# Patient Record
Sex: Male | Born: 1935
Health system: Southern US, Community
[De-identification: ages and names within clinical notes are randomized; demographics above are authoritative.]

## PROBLEM LIST (undated history)

## (undated) DIAGNOSIS — M109 Gout, unspecified: Secondary | ICD-10-CM

## (undated) DIAGNOSIS — C4490 Unspecified malignant neoplasm of skin, unspecified: Secondary | ICD-10-CM

## (undated) DIAGNOSIS — J449 Chronic obstructive pulmonary disease, unspecified: Secondary | ICD-10-CM

## (undated) DIAGNOSIS — I10 Essential (primary) hypertension: Secondary | ICD-10-CM

## (undated) HISTORY — DX: Essential (primary) hypertension: I10

## (undated) HISTORY — DX: Gout, unspecified: M10.9

## (undated) HISTORY — DX: Chronic obstructive pulmonary disease, unspecified: J44.9

## (undated) HISTORY — PX: OTHER SURGICAL HISTORY: SHX169

## (undated) HISTORY — PX: EXTERNAL EAR SURGERY: SHX627

## (undated) HISTORY — DX: Unspecified malignant neoplasm of skin, unspecified: C44.90

---

## 2012-02-02 DIAGNOSIS — C44221 Squamous cell carcinoma of skin of unspecified ear and external auricular canal: Secondary | ICD-10-CM | POA: Insufficient documentation

## 2014-05-15 DIAGNOSIS — I10 Essential (primary) hypertension: Secondary | ICD-10-CM | POA: Insufficient documentation

## 2014-05-15 DIAGNOSIS — J449 Chronic obstructive pulmonary disease, unspecified: Secondary | ICD-10-CM | POA: Insufficient documentation

## 2014-10-11 DIAGNOSIS — E7800 Pure hypercholesterolemia, unspecified: Secondary | ICD-10-CM | POA: Insufficient documentation

## 2015-04-21 DIAGNOSIS — J432 Centrilobular emphysema: Secondary | ICD-10-CM | POA: Insufficient documentation

## 2015-04-21 DIAGNOSIS — M1A0720 Idiopathic chronic gout, left ankle and foot, without tophus (tophi): Secondary | ICD-10-CM | POA: Insufficient documentation

## 2018-01-06 DIAGNOSIS — R972 Elevated prostate specific antigen [PSA]: Secondary | ICD-10-CM | POA: Diagnosis not present

## 2018-01-06 DIAGNOSIS — I10 Essential (primary) hypertension: Secondary | ICD-10-CM | POA: Diagnosis not present

## 2018-01-06 DIAGNOSIS — J432 Centrilobular emphysema: Secondary | ICD-10-CM | POA: Diagnosis not present

## 2018-01-06 DIAGNOSIS — E7800 Pure hypercholesterolemia, unspecified: Secondary | ICD-10-CM | POA: Diagnosis not present

## 2018-02-22 DIAGNOSIS — Z85828 Personal history of other malignant neoplasm of skin: Secondary | ICD-10-CM | POA: Diagnosis not present

## 2018-02-22 DIAGNOSIS — L570 Actinic keratosis: Secondary | ICD-10-CM | POA: Diagnosis not present

## 2018-02-22 DIAGNOSIS — X32XXXA Exposure to sunlight, initial encounter: Secondary | ICD-10-CM | POA: Diagnosis not present

## 2018-02-22 DIAGNOSIS — D485 Neoplasm of uncertain behavior of skin: Secondary | ICD-10-CM | POA: Diagnosis not present

## 2018-02-22 DIAGNOSIS — C4442 Squamous cell carcinoma of skin of scalp and neck: Secondary | ICD-10-CM | POA: Diagnosis not present

## 2018-02-22 DIAGNOSIS — Z872 Personal history of diseases of the skin and subcutaneous tissue: Secondary | ICD-10-CM | POA: Diagnosis not present

## 2018-02-22 DIAGNOSIS — Z08 Encounter for follow-up examination after completed treatment for malignant neoplasm: Secondary | ICD-10-CM | POA: Diagnosis not present

## 2018-03-02 DIAGNOSIS — R972 Elevated prostate specific antigen [PSA]: Secondary | ICD-10-CM | POA: Diagnosis not present

## 2018-04-12 DIAGNOSIS — L905 Scar conditions and fibrosis of skin: Secondary | ICD-10-CM | POA: Diagnosis not present

## 2018-04-12 DIAGNOSIS — C4442 Squamous cell carcinoma of skin of scalp and neck: Secondary | ICD-10-CM | POA: Diagnosis not present

## 2018-05-23 DIAGNOSIS — E7800 Pure hypercholesterolemia, unspecified: Secondary | ICD-10-CM | POA: Diagnosis not present

## 2018-05-23 DIAGNOSIS — M1A0720 Idiopathic chronic gout, left ankle and foot, without tophus (tophi): Secondary | ICD-10-CM | POA: Diagnosis not present

## 2018-05-23 DIAGNOSIS — J432 Centrilobular emphysema: Secondary | ICD-10-CM | POA: Diagnosis not present

## 2018-05-23 DIAGNOSIS — Z0000 Encounter for general adult medical examination without abnormal findings: Secondary | ICD-10-CM | POA: Diagnosis not present

## 2018-05-23 DIAGNOSIS — R972 Elevated prostate specific antigen [PSA]: Secondary | ICD-10-CM | POA: Diagnosis not present

## 2018-05-23 DIAGNOSIS — I10 Essential (primary) hypertension: Secondary | ICD-10-CM | POA: Diagnosis not present

## 2018-07-19 DIAGNOSIS — L570 Actinic keratosis: Secondary | ICD-10-CM | POA: Diagnosis not present

## 2018-07-19 DIAGNOSIS — Z08 Encounter for follow-up examination after completed treatment for malignant neoplasm: Secondary | ICD-10-CM | POA: Diagnosis not present

## 2018-07-19 DIAGNOSIS — Z872 Personal history of diseases of the skin and subcutaneous tissue: Secondary | ICD-10-CM | POA: Diagnosis not present

## 2018-07-19 DIAGNOSIS — D485 Neoplasm of uncertain behavior of skin: Secondary | ICD-10-CM | POA: Diagnosis not present

## 2018-07-19 DIAGNOSIS — Z85828 Personal history of other malignant neoplasm of skin: Secondary | ICD-10-CM | POA: Diagnosis not present

## 2018-07-19 DIAGNOSIS — C44319 Basal cell carcinoma of skin of other parts of face: Secondary | ICD-10-CM | POA: Diagnosis not present

## 2018-07-19 DIAGNOSIS — X32XXXA Exposure to sunlight, initial encounter: Secondary | ICD-10-CM | POA: Diagnosis not present

## 2018-08-07 ENCOUNTER — Other Ambulatory Visit: Payer: Self-pay | Admitting: *Deleted

## 2018-08-07 NOTE — Patient Outreach (Signed)
Telephone call to pt for follow up on HTA health risk assessment completed by pt, spoke with pt, HIPAA verified, pt reports he has COPD and managed " very well" , has HTN and cholesterol issues that are under control.  Pt states " I'm doing great, getting outdoors"  Pt states he sees Dr. Thereasa Distance q 6 months at [facility] in [place].  Pt states he has no needs for care management.  Successful outreach letter mailed to pt home.  Jacqlyn Larsen [facility], [place] Coordinator [phone]

## 2018-09-20 DIAGNOSIS — C44319 Basal cell carcinoma of skin of other parts of face: Secondary | ICD-10-CM | POA: Diagnosis not present

## 2018-10-04 ENCOUNTER — Other Ambulatory Visit: Payer: Self-pay | Admitting: Family Medicine

## 2018-10-04 DIAGNOSIS — R0789 Other chest pain: Secondary | ICD-10-CM | POA: Diagnosis not present

## 2018-10-04 DIAGNOSIS — J449 Chronic obstructive pulmonary disease, unspecified: Secondary | ICD-10-CM

## 2018-10-04 DIAGNOSIS — J432 Centrilobular emphysema: Secondary | ICD-10-CM | POA: Diagnosis not present

## 2018-10-04 DIAGNOSIS — R0602 Shortness of breath: Secondary | ICD-10-CM | POA: Diagnosis not present

## 2018-10-09 ENCOUNTER — Other Ambulatory Visit: Payer: Self-pay | Admitting: Family Medicine

## 2018-10-09 DIAGNOSIS — R0602 Shortness of breath: Secondary | ICD-10-CM

## 2018-10-16 ENCOUNTER — Ambulatory Visit
Admission: RE | Admit: 2018-10-16 | Discharge: 2018-10-16 | Disposition: A | Discharge status: Home / Self Care | Payer: PPO | Source: Ambulatory Visit | Attending: Family Medicine | Admitting: Family Medicine

## 2018-10-16 DIAGNOSIS — N281 Cyst of kidney, acquired: Secondary | ICD-10-CM | POA: Diagnosis not present

## 2018-10-16 DIAGNOSIS — K769 Liver disease, unspecified: Secondary | ICD-10-CM | POA: Diagnosis not present

## 2018-10-16 DIAGNOSIS — J439 Emphysema, unspecified: Secondary | ICD-10-CM | POA: Diagnosis not present

## 2018-10-16 DIAGNOSIS — J432 Centrilobular emphysema: Secondary | ICD-10-CM | POA: Diagnosis not present

## 2018-10-16 DIAGNOSIS — R918 Other nonspecific abnormal finding of lung field: Secondary | ICD-10-CM | POA: Insufficient documentation

## 2018-10-16 DIAGNOSIS — R0602 Shortness of breath: Secondary | ICD-10-CM | POA: Diagnosis not present

## 2018-10-16 DIAGNOSIS — I2510 Atherosclerotic heart disease of native coronary artery without angina pectoris: Secondary | ICD-10-CM | POA: Diagnosis not present

## 2018-10-24 ENCOUNTER — Telehealth: Payer: Self-pay | Admitting: *Deleted

## 2018-10-24 NOTE — Telephone Encounter (Signed)
Left message on patients phone, per Dr.Oaks to see if patient can come in at 10:00 tomorrow (10/25/18) for a 10:30am appointment. Dr.Oaks has a case at noon so patient needs to be here at 10:00am

## 2018-10-25 ENCOUNTER — Other Ambulatory Visit: Payer: Self-pay | Admitting: *Deleted

## 2018-10-25 ENCOUNTER — Other Ambulatory Visit: Payer: Self-pay

## 2018-10-25 ENCOUNTER — Encounter: Payer: Self-pay | Admitting: Cardiothoracic Surgery

## 2018-10-25 ENCOUNTER — Ambulatory Visit (INDEPENDENT_AMBULATORY_CARE_PROVIDER_SITE_OTHER): Payer: PPO | Admitting: Cardiothoracic Surgery

## 2018-10-25 VITALS — BP 169/89 | HR 65 | Temp 97.9°F | Resp 16 | Ht 67.0 in | Wt 139.8 lb

## 2018-10-25 DIAGNOSIS — R918 Other nonspecific abnormal finding of lung field: Secondary | ICD-10-CM

## 2018-10-25 NOTE — Progress Notes (Signed)
Patient ID: Darin Williams, male   DOB: 11-09-1936, 82 y.o.   MRN: [id number]  Chief Complaint  Patient presents with  . New Patient (Initial Visit)    Mass right lung    Referred By Dr. Craige Cotta Reason for Referral right upper lobe mass  HPI Location, Quality, Duration, Severity, Timing, Context, Modifying Factors, Associated Signs and Symptoms.  Darin Williams is a 82 y.o. male.  He was in his usual state of health until couple weeks ago when he began experiencing increasing shortness of breath particularly while singing in church.  He states that he had a slight cough and was thinking that he might be developing a cold and he sought attention with his primary care physician.  A chest x-ray was made and a subsequent CT scan was performed.  The patient states that he has had no prior x-rays in the last 20 years for comparison but the CT scan was markedly abnormal with a mass like density in the right upper lung zone.  He was treated with albuterol and oral antibiotics and has improved considerably.  He states that he is now back to his baseline.  He has had a cough which is been nonproductive.  He has a past medical history significant for smoking of 52 years but he quit about 10 years ago.  Also there is a very strong positive family history for lung cancer in his father grandfather and uncle.  They were all smokers.  There is no known asbestos exposure.  He has had a 10 to 12 pound weight loss but has gained several pounds back since he has been on the antibiotics and albuterol.  He does get short of breath with exertion.  He is able to sing in the choir and does so without difficulty.   Past Medical History:  Diagnosis Date  . COPD (chronic obstructive pulmonary disease) ([place])   . Gout   . Hypertension   . Skin cancer     Past Surgical History:  Procedure Laterality Date  . EXTERNAL EAR SURGERY    . mohl's surgery      Family History  Problem Relation Age of Onset  . Dementia  Mother   . Lung cancer Father   . Lung cancer Paternal Uncle   . Lung cancer Paternal Grandfather     Social History Social History   Tobacco Use  . Smoking status: Former Smoker    Last attempt to quit: 2005    Years since quitting: 14.8  . Smokeless tobacco: Never Used  Substance Use Topics  . Alcohol use: Never    Frequency: Never  . Drug use: Never    Not on File  Current Outpatient Medications  Medication Sig Dispense Refill  . albuterol (PROVENTIL HFA;VENTOLIN HFA) 108 (90 Base) MCG/ACT inhaler Inhale into the lungs.    Marland Kitchen amLODipine (NORVASC) 5 MG tablet Take by mouth.    . cetirizine (ZYRTEC) 10 MG tablet Take by mouth.    . levofloxacin (LEVAQUIN) 500 MG tablet Take by mouth.    . levofloxacin (LEVAQUIN) 500 MG tablet Take 500 mg by mouth daily.  0  . lisinopril (PRINIVIL,ZESTRIL) 20 MG tablet Take by mouth.    . Omega-3 1000 MG CAPS Take by mouth.    . simvastatin (ZOCOR) 20 MG tablet Take by mouth.    . SPIRIVA HANDIHALER 18 MCG inhalation capsule PLACE ONE CAPSULE INTO INHALER AND INHALE ONCE DAILY  3  . Multiple Vitamin (MULTI-VITAMINS) TABS Take by  mouth.     No current facility-administered medications for this visit.       Review of Systems A complete review of systems was asked and was negative except for the following positive findings weight loss, hearing loss.  Blood pressure (!) 169/89, pulse 65, temperature 97.9 F (36.6 C), temperature source Temporal, resp. rate 16, height 5\' 7"  (1.702 m), weight 139 lb 12.8 oz (63.4 kg), SpO2 96 %.  Physical Exam CONSTITUTIONAL:  Pleasant, well-developed, well-nourished, and in no acute distress. EYES: Pupils equal and reactive to light, Sclera non-icteric EARS, NOSE, MOUTH AND THROAT:  The oropharynx was clear.  Dentition is absent with denture repair.  Oral mucosa pink and moist. LYMPH NODES:  Lymph nodes in the neck and axillae were normal RESPIRATORY:  Lungs were clear.  Normal respiratory effort without  pathologic use of accessory muscles of respiration CARDIOVASCULAR: Heart was regular without murmurs.  There were no carotid bruits. GI: The abdomen was soft, nontender, and nondistended. There were no palpable masses. There was no hepatosplenomegaly. There were normal bowel sounds in all quadrants. GU:  Rectal deferred.   MUSCULOSKELETAL:  Normal muscle strength and tone.  No clubbing or cyanosis.   SKIN:  There were no pathologic skin lesions.  There were no nodules on palpation.  He has innumerable scars from his prior skin cancer surgery. NEUROLOGIC:  Sensation is normal.  Cranial nerves are grossly intact. PSYCH:  Oriented to person, place and time.  Mood and affect are normal.  Data Reviewed CT scan  I have personally reviewed the patient's imaging, laboratory findings and medical records.    Assessment    Right upper lobe mass    Plan    I have independently reviewed the patient's CT scan and discussed these findings with the patient and his family.  The masslike lesion in the right upper lobe is calcified suggesting chronicity.  There is marked architectural distortion of both lungs given his severe underlying emphysema.  However given his long smoking history and positive family history lung cancer certainly a possibility.  Therefore I would like to proceed with a CT-guided needle biopsy of the right upper lobe masslike area.  I had a long discussion with the patient regarding the options.  These would include repeat CT scan in short-term follow-up, PET scan, bronchoscopy with biopsy and finally CT-guided biopsy.  I reviewed with him the indications and risks as well as the advantages and disadvantages of these techniques.  He would like to proceed on with a CT-guided needle biopsy.  I will set this up.  We will be certain to send the biopsy for cultures as well.  I will see the patient back in 10 days in follow-up.       Nestor Lewandowsky, MD 10/25/2018, 11:05 AM

## 2018-10-25 NOTE — Patient Instructions (Signed)
Dr. Genevive Bi will speak with the radiologist and give you a call.

## 2018-10-27 ENCOUNTER — Telehealth: Payer: Self-pay

## 2018-10-27 NOTE — Telephone Encounter (Signed)
Spoke with patient and he is in agreement with his daughter, Lattie Haw that he would like to stay local for his CT Guided lung biopsy. I let him know that they require a PET scan first and that I was waiting to hear back from Dr Genevive Bi on what type to order. He is amendable to this and will await our call back.

## 2018-10-30 ENCOUNTER — Other Ambulatory Visit: Payer: Self-pay | Admitting: *Deleted

## 2018-10-30 ENCOUNTER — Encounter: Payer: Self-pay | Admitting: *Deleted

## 2018-10-30 DIAGNOSIS — R918 Other nonspecific abnormal finding of lung field: Secondary | ICD-10-CM

## 2018-10-30 NOTE — Progress Notes (Addendum)
Per the radiologist's recommendation, patient would need a PET scan prior to CT guided FNA/core biopsy of RUL lung mass.  It was offered to patient to see if we could get biopsy done at [facility] without having to have a PET scan first per Dr. Genevive Bi' request.   The patient would like to remain local and proceed with PET scan.  The patient has been scheduled for a PET scan at [facility] for 11-02-18 at 12:30 pm (arrive 12 noon). Prep: NPO 6 hours prior. The patient is aware of date, time, and instructions.   Marcie Bal with the Specialty Scheduling Department has been notified of plans for PET scan and will send for radiologist review once results are available so we can get biopsy arranged accordingly.

## 2018-11-02 ENCOUNTER — Ambulatory Visit
Admission: RE | Admit: 2018-11-02 | Discharge: 2018-11-02 | Disposition: A | Discharge status: Home / Self Care | Payer: PPO | Source: Ambulatory Visit | Attending: Cardiothoracic Surgery | Admitting: Cardiothoracic Surgery

## 2018-11-02 DIAGNOSIS — N281 Cyst of kidney, acquired: Secondary | ICD-10-CM | POA: Diagnosis not present

## 2018-11-02 DIAGNOSIS — I700 Atherosclerosis of aorta: Secondary | ICD-10-CM | POA: Insufficient documentation

## 2018-11-02 DIAGNOSIS — K8020 Calculus of gallbladder without cholecystitis without obstruction: Secondary | ICD-10-CM | POA: Insufficient documentation

## 2018-11-02 DIAGNOSIS — R918 Other nonspecific abnormal finding of lung field: Secondary | ICD-10-CM | POA: Insufficient documentation

## 2018-11-02 DIAGNOSIS — J439 Emphysema, unspecified: Secondary | ICD-10-CM | POA: Insufficient documentation

## 2018-11-02 LAB — GLUCOSE, CAPILLARY: Glucose-Capillary: 93 mg/dL (ref 70–99)

## 2018-11-02 MED ORDER — FLUDEOXYGLUCOSE F - 18 (FDG) INJECTION
7.6500 | Freq: Once | INTRAVENOUS | Status: AC | PRN
  Administered 2018-11-02: 7.65 via INTRAVENOUS

## 2018-11-06 DIAGNOSIS — C3411 Malignant neoplasm of upper lobe, right bronchus or lung: Secondary | ICD-10-CM | POA: Diagnosis not present

## 2018-11-07 ENCOUNTER — Telehealth: Payer: Self-pay

## 2018-11-07 NOTE — Telephone Encounter (Signed)
Central scheduled and the patient is scheduled for a CT guided lung biopsy at [facility] on 11/13/18 at 11:00 am. He is to arrive there by 10:00 am. The nurse will call him a few days prior to review instructions. The patient is aware of date, time, and instructions.

## 2018-11-08 NOTE — Telephone Encounter (Signed)
A follow up appointment has been scheduled for the patient to see Dr. Genevive Bi on 11-17-18 at 8:30 am. Patient aware.

## 2018-11-09 ENCOUNTER — Other Ambulatory Visit: Payer: Self-pay | Admitting: Radiology

## 2018-11-10 ENCOUNTER — Ambulatory Visit: Payer: Self-pay | Admitting: Cardiothoracic Surgery

## 2018-11-13 ENCOUNTER — Other Ambulatory Visit: Payer: Self-pay

## 2018-11-13 ENCOUNTER — Ambulatory Visit
Admission: RE | Admit: 2018-11-13 | Discharge: 2018-11-13 | Disposition: A | Discharge status: Home / Self Care | Payer: PPO | Source: Ambulatory Visit | Attending: Interventional Radiology | Admitting: Interventional Radiology

## 2018-11-13 ENCOUNTER — Ambulatory Visit
Admission: RE | Admit: 2018-11-13 | Discharge: 2018-11-13 | Disposition: A | Discharge status: Home / Self Care | Payer: PPO | Source: Ambulatory Visit | Attending: Cardiothoracic Surgery | Admitting: Cardiothoracic Surgery

## 2018-11-13 DIAGNOSIS — J95811 Postprocedural pneumothorax: Secondary | ICD-10-CM | POA: Diagnosis not present

## 2018-11-13 DIAGNOSIS — J439 Emphysema, unspecified: Secondary | ICD-10-CM | POA: Insufficient documentation

## 2018-11-13 DIAGNOSIS — I10 Essential (primary) hypertension: Secondary | ICD-10-CM | POA: Diagnosis not present

## 2018-11-13 DIAGNOSIS — Z87891 Personal history of nicotine dependence: Secondary | ICD-10-CM | POA: Diagnosis not present

## 2018-11-13 DIAGNOSIS — Z79899 Other long term (current) drug therapy: Secondary | ICD-10-CM | POA: Diagnosis not present

## 2018-11-13 DIAGNOSIS — R918 Other nonspecific abnormal finding of lung field: Secondary | ICD-10-CM | POA: Insufficient documentation

## 2018-11-13 DIAGNOSIS — Z8709 Personal history of other diseases of the respiratory system: Secondary | ICD-10-CM | POA: Diagnosis not present

## 2018-11-13 DIAGNOSIS — Z9889 Other specified postprocedural states: Secondary | ICD-10-CM | POA: Insufficient documentation

## 2018-11-13 DIAGNOSIS — D381 Neoplasm of uncertain behavior of trachea, bronchus and lung: Secondary | ICD-10-CM | POA: Diagnosis not present

## 2018-11-13 LAB — CBC
HEMATOCRIT: 44.3 % (ref 39.0–52.0)
HEMOGLOBIN: 14.6 g/dL (ref 13.0–17.0)
MCH: 33.3 pg (ref 26.0–34.0)
MCHC: 33 g/dL (ref 30.0–36.0)
MCV: 101.1 fL — AB (ref 80.0–100.0)
PLATELETS: 260 10*3/uL (ref 150–400)
RBC: 4.38 MIL/uL (ref 4.22–5.81)
RDW: 13.4 % (ref 11.5–15.5)
WBC: 5.7 10*3/uL (ref 4.0–10.5)
nRBC: 0 % (ref 0.0–0.2)

## 2018-11-13 LAB — PROTIME-INR
INR: 0.88
Prothrombin Time: 11.9 seconds (ref 11.4–15.2)

## 2018-11-13 MED ORDER — SODIUM CHLORIDE 0.9 % IV SOLN
INTRAVENOUS | Status: DC
  Administered 2018-11-13: 10:00:00 via INTRAVENOUS

## 2018-11-13 MED ORDER — MIDAZOLAM HCL 5 MG/5ML IJ SOLN
INTRAMUSCULAR | Status: AC
  Filled 2018-11-13: qty 5

## 2018-11-13 MED ORDER — MIDAZOLAM HCL 5 MG/5ML IJ SOLN
INTRAMUSCULAR | Status: AC | PRN
  Administered 2018-11-13: 1 mg via INTRAVENOUS
  Administered 2018-11-13 (×2): 0.5 mg via INTRAVENOUS

## 2018-11-13 MED ORDER — FENTANYL CITRATE (PF) 100 MCG/2ML IJ SOLN
INTRAMUSCULAR | Status: AC | PRN
  Administered 2018-11-13: 50 ug via INTRAVENOUS
  Administered 2018-11-13: 25 ug via INTRAVENOUS

## 2018-11-13 MED ORDER — FENTANYL CITRATE (PF) 100 MCG/2ML IJ SOLN
INTRAMUSCULAR | Status: AC
  Filled 2018-11-13: qty 4

## 2018-11-13 NOTE — Discharge Instructions (Signed)
Needle Biopsy, Care After °Refer to this sheet in the next few weeks. These instructions provide you with information about caring for yourself after your procedure. Your health care provider may also give you more specific instructions. Your treatment has been planned according to current medical practices, but problems sometimes occur. Call your health care provider if you have any problems or questions after your procedure. °What can I expect after the procedure? °After your procedure, it is common to have soreness, bruising, or mild pain at the biopsy site. This should go away in a few days. °Follow these instructions at home: °· Rest as directed by your health care provider. °· Take medicines only as directed by your health care provider. °· There are many different ways to close and cover the biopsy site, including stitches (sutures), skin glue, and adhesive strips. Follow your health care provider's instructions about: °? Biopsy site care. °? Bandage (dressing) changes and removal. °? Biopsy site closure removal. °· Check your biopsy site every day for signs of infection. Watch for: °? Redness, swelling, or pain. °? Fluid, blood, or pus. °Contact a health care provider if: °· You have a fever. °· You have redness, swelling, or pain at the biopsy site that lasts longer than a few days. °· You have fluid, blood, or pus coming from the biopsy site. °· You feel nauseous. °· You vomit. °Get help right away if: °· You have shortness of breath. °· You have trouble breathing. °· You have chest pain. °· You feel dizzy or you faint. °· You have bleeding that does not stop with pressure or a bandage. °· You cough up blood. °· You have pain in your abdomen. °This information is not intended to replace advice given to you by your health care provider. Make sure you discuss any questions you have with your health care provider. °Document Released: 04/29/2015 Document Revised: 05/20/2016 Document Reviewed:  12/09/2014 °Elsevier Interactive Patient Education © 2018 Elsevier Inc. ° °

## 2018-11-13 NOTE — H&P (Signed)
Chief Complaint: Patient was seen in consultation today for right lung mass biopsy at the request of Oaks,Timothy  Referring Physician(s): Oaks,Timothy  Patient Status: [facility] - Out-pt  History of Present Illness: Darin Williams is a 82 y.o. male former smoker with COPD presenting with a 4-5 cm RUL lung mass demonstrating increased metabolic activity by PET with possible right hilar and subcarinal lymph node metastatic activity as well on PET. Was having some cough and dyspnea when diagnosed but now not having any significant symptoms. Has been evaluated by Dr. Genevive Bi who recommended biopsy for tissue diagnosis before determining how best to treat Darin Williams.  Past Medical History:  Diagnosis Date  . COPD (chronic obstructive pulmonary disease) ([place])   . Gout   . Hypertension   . Skin cancer     Past Surgical History:  Procedure Laterality Date  . EXTERNAL EAR SURGERY    . mohl's surgery      Allergies: Patient has no allergy information on record.  Medications: Prior to Admission medications   Medication Sig Start Date End Date Taking? Authorizing Provider  albuterol (PROVENTIL HFA;VENTOLIN HFA) 108 (90 Base) MCG/ACT inhaler Inhale into the lungs. 05/23/18  Yes [provider]  amLODipine (NORVASC) 5 MG tablet Take by mouth. 10/11/14  Yes [provider]  lisinopril (PRINIVIL,ZESTRIL) 20 MG tablet Take by mouth. 10/11/14  Yes [provider]  simvastatin (ZOCOR) 20 MG tablet Take by mouth. 10/11/14  Yes [provider]  SPIRIVA HANDIHALER 18 MCG inhalation capsule PLACE ONE CAPSULE INTO INHALER AND INHALE ONCE DAILY 10/18/18  Yes [provider]  cetirizine (ZYRTEC) 10 MG tablet Take by mouth.    [provider]  levofloxacin (LEVAQUIN) 500 MG tablet Take 500 mg by mouth daily. 10/18/18   [provider]  Multiple Vitamin (MULTI-VITAMINS) TABS Take by mouth.    [provider]  Omega-3 1000 MG CAPS Take by  mouth.    [provider]     Family History  Problem Relation Age of Onset  . Dementia Mother   . Lung cancer Father   . Lung cancer Paternal Uncle   . Lung cancer Paternal Grandfather     Social History   Socioeconomic History  . Marital status: Married    Spouse name: Not on file  . Number of children: Not on file  . Years of education: Not on file  . Highest education level: Not on file  Occupational History  . Not on file  Social Needs  . Financial resource strain: Not on file  . Food insecurity:    Worry: Not on file    Inability: Not on file  . Transportation needs:    Medical: Not on file    Non-medical: Not on file  Tobacco Use  . Smoking status: Former Smoker    Last attempt to quit: 2005    Years since quitting: 14.8  . Smokeless tobacco: Never Used  Substance and Sexual Activity  . Alcohol use: Never    Frequency: Never  . Drug use: Never  . Sexual activity: Not on file  Lifestyle  . Physical activity:    Days per week: Not on file    Minutes per session: Not on file  . Stress: Not on file  Relationships  . Social connections:    Talks on phone: Not on file    Gets together: Not on file    Attends religious service: Not on file    Active member of  club or organization: Not on file    Attends meetings of clubs or organizations: Not on file    Relationship status: Not on file  Other Topics Concern  . Not on file  Social History Narrative  . Not on file    ECOG Status: 0 - Asymptomatic  Review of Systems: A 12 point ROS discussed and pertinent positives are indicated in the HPI above.  All other systems are negative.  Review of Systems  Constitutional: Negative.   HENT: Negative.   Respiratory:       Previous dyspnea now improved.  Cardiovascular: Negative.   Gastrointestinal: Negative.   Genitourinary: Negative.   Musculoskeletal: Negative.   Skin:       History of multiple facial procedures for skin cancers.  Neurological:  Negative.     Vital Signs: BP (!) 182/94   Temp 98.5 F (36.9 C) (Oral)   Resp 19   Wt 63.5 kg   SpO2 95%   BMI 21.93 kg/m   Physical Exam  Constitutional: He is oriented to person, place, and time. No distress.  HENT:  Head: Normocephalic and atraumatic.  Neck: Neck supple. No JVD present. No thyromegaly present.  Cardiovascular: Normal rate, regular rhythm and normal heart sounds. Exam reveals no gallop and no friction rub.  No murmur heard. Pulmonary/Chest: Effort normal. No stridor. No respiratory distress. He has no wheezes. He has no rales.  Distant bilateral breath sounds.  Abdominal: Soft. Bowel sounds are normal. He exhibits no distension and no mass. There is no tenderness. There is no rebound and no guarding.  Musculoskeletal: He exhibits no edema.  Lymphadenopathy:    He has no cervical adenopathy.  Neurological: He is alert and oriented to person, place, and time.  Skin: Skin is warm and dry. He is not diaphoretic.  Vitals reviewed.   Imaging: Ct Chest Wo Contrast  Result Date: 10/16/2018 CLINICAL DATA:  82 year old male with increasing shortness of breath while singing, mild cough for 1 month. History of COPD. EXAM: CT CHEST WITHOUT CONTRAST TECHNIQUE: Multidetector CT imaging of the chest was performed following the standard protocol without IV contrast. COMPARISON:  Report of [facility] chest and rib series 10/04/2018 (no images available). FINDINGS: Cardiovascular: Calcified coronary artery atherosclerosis and/or stents. No cardiomegaly or pericardial effusion. Comparatively mild Calcified aortic atherosclerosis. Mediastinum/Nodes: No mediastinal lymphadenopathy. There is no definite hilar lymphadenopathy evident in the absence of IV contrast. Negative noncontrast thoracic inlet, the glottis is closed. Lungs/Pleura: Centrilobular and paraseptal emphysema is moderate to severe throughout both lungs. There is architectural distortion in the right lung apex with  a small volume of layering fluid suspected within an apical bulla (series 2, image 18). Additionally, there is a large partially calcified area of masslike opacity in the posteromedial right upper lobe contiguous with the posterior mediastinum encompassing about 4 x 4.6 centimeters. See series 3, image 36, series 2, image 36 and coronal image 77. There is bronchiectasis associated. This dominant lesion is also contiguous with an additional nodular area parenchymal opacity in the lateral right upper lobe on series 3, image 49 measuring about 2.1 centimeters. Associated architectural distortion throughout the region. No layering pleural effusion. Mild architectural distortion in the superior segment of the right lower lobe and anterior right upper has a more benign appearance. No suspicious finding in the left lung. Upper Abdomen: Large but simple fluid density 11 centimeter right upper pole renal cyst. Partially visible right middle pole cyst. There is moderate atrophy of the left kidney  with a small simple fluid left upper pole cyst also. In the right hepatic lobe there is a low to intermediate hypodense lesion measuring 14 millimeters on series 2, image 144 which is indeterminate. Cholelithiasis is evident with no pericholecystic inflammation. Negative visible noncontrast spleen, pancreas, adrenal glands, and bowel in the upper abdomen. Musculoskeletal: No acute or suspicious osseous lesion identified. IMPRESSION: 1. Emphysema (ICD10-J43.9) with bullous disease and architectural distortion in the right upper lung. Confluent 4.5 cm mass-like area in the posterior right upper lobe contiguous with the posterior mediastinum could be postinflammatory but is indeterminate, and contiguous with a smaller 2.1 cm spiculated area laterally. A small volume of layering fluid in a right apical bulla is suspicious for superimposed infection. Recommend patient referral to Multidisciplinary Thoracic [facility])  and/or consultation with Pulmonary Medicine or Thoracic Surgery. 2. Small indeterminate 14 mm low-density lesion in the posterior right hepatic lobe. If the lung process ultimately is found to be benign then benign etiology here (such as cyst or hemangioma) would be strongly favored. 3. Left renal atrophy. Partially visible renal cysts, including a large but simple fluid density right upper pole cyst. 4. Calcified coronary artery atherosclerosis. Electronically Signed   By: Genevie Ann M.D.   On: 10/16/2018 09:11   Nm Pet Image Initial (pi) Skull Base To Thigh  Result Date: 11/02/2018 CLINICAL DATA:  Initial treatment strategy for right upper lobe mass. EXAM: NUCLEAR MEDICINE PET SKULL BASE TO THIGH TECHNIQUE: 7.7 mCi F-18 FDG was injected intravenously. Full-ring PET imaging was performed from the skull base to thigh after the radiotracer. CT data was obtained and used for attenuation correction and anatomic localization. Fasting blood glucose: 93 mg/dl COMPARISON:  Chest CT 10/16/2018 FINDINGS: Mediastinal blood pool activity: SUV max 2.3 NECK: No hypermetabolic lymph nodes in the neck. Incidental CT findings: none CHEST: The patient's known posterior right upper lobe mass lesion shows marked hypermetabolism with SUV max = 6.8. The most hypermetabolic portion of this lesion is in the cranial and medial paraspinal component in the region of the dense dystrophic calcification. The more peripheral and inferior portions of the lesion also show FDG accumulation, but to a lesser degree. Focal areas of hypermetabolism in the right hilum suggest nodal metastases although discrete right hilar lymph nodes are not evident on this noncontrast study. Focal FDG accumulation in the subcarinal region appears may be related to the small subcarinal lymph node, but cannot be definitely separated from the esophagus at this level. No evidence for hypermetabolic disease in the left hilum. Incidental CT findings: Centrilobular and  paraseptal emphysema noted. ABDOMEN/PELVIS: No abnormal hypermetabolic activity within the liver, pancreas, adrenal glands, or spleen. No hypermetabolic lymph nodes in the abdomen or pelvis. Incidental CT findings: Small posterior right liver cyst evident. Bilateral renal cysts are photopenic on PET imaging. Dominant cyst is in the upper pole the right kidney and measures 10.5 cm. Left kidney is markedly atrophic. Tiny calcified gallstones noted. There is abdominal aortic atherosclerosis without aneurysm. Insert diverticulosis left prostate gland is enlarged. SKELETON: No focal hypermetabolic activity to suggest skeletal metastasis. Incidental CT findings: none IMPRESSION: 1. Marked hypermetabolism identified in the cranial and medial (paraspinal) component of the patient's large, irregular posterior right upper lobe mass. Uptake is highly suspicious for malignancy. The most hypermetabolic portion of this lesion is in the proximity of the large dystrophic intralesional calcification. The more peripheral and inferior components of the lesion also show FDG accumulation, but to a lesser degree. 2. Foci of hypermetabolism  in the right hilum is concerning for metastatic disease although discrete lymph nodes are not evident on this noncontrast exam. Small focus of hypermetabolism in the subcarinal region likely related to the esophagus as esophageal activity is also seen more distally. 3. No evidence for metastatic disease in the neck, abdomen, or pelvis. 4. Bilateral renal cysts with atrophy of the left kidney. 5. Cholelithiasis. 6.  Aortic Atherosclerois (ICD10-170.0) 7.  Emphysema. ([id number]) Electronically Signed   By: Misty Stanley M.D.   On: 11/02/2018 20:33    Labs:  CBC: Recent Labs    11/13/18 0954  WBC 5.7  HGB 14.6  HCT 44.3  PLT 260    COAGS: Recent Labs    11/13/18 0954  INR 0.88    BMP: No results for input(s): NA, K, CL, CO2, GLUCOSE, BUN, CALCIUM, CREATININE, GFRNONAA, GFRAA in  the last 8760 hours.  Invalid input(s): CMP  LIVER FUNCTION TESTS: No results for input(s): BILITOT, AST, ALT, ALKPHOS, PROT, ALBUMIN in the last 8760 hours.  TUMOR MARKERS: No results for input(s): AFPTM, CEA, CA199, CHROMGRNA in the last 8760 hours.  Assessment and Plan:  Right upper lobe mass with possible lymph node metastases by PET. For CT guided biopsy of RUL mass from posterior approach today.  Risks and benefits discussed with the patient including, but not limited to bleeding, hemoptysis, respiratory failure requiring intubation, infection, pneumothorax requiring chest tube placement, stroke from air embolism or even death. All of the patient's questions were answered, patient is agreeable to proceed. Consent signed and in chart.  Thank you for this interesting consult.  I greatly enjoyed meeting Darin Williams and look forward to participating in their care.  A copy of this report was sent to the requesting provider on this date.  Electronically Signed: Azzie Roup, MD 11/13/2018, 10:51 AM     I spent a total of 30 Minutes in face to face in clinical consultation, greater than 50% of which was counseling/coordinating care for right upper lobe lung mass biopsy.

## 2018-11-13 NOTE — Procedures (Signed)
Interventional Radiology Procedure Note  Procedure: CT guided biopsy of RUL lung mass  Complications: None  Estimated Blood Loss: < 10 mL  Findings: RUL mass sampled via 17 G needle with 18 G core biopsy x 3. Biosentry device used to deposit plug at pleural entry site on completion.  Venetia Night. Kathlene Cote, M.D Pager:  [phone]

## 2018-11-15 DIAGNOSIS — R918 Other nonspecific abnormal finding of lung field: Secondary | ICD-10-CM | POA: Diagnosis not present

## 2018-11-17 ENCOUNTER — Ambulatory Visit: Payer: Self-pay | Admitting: Cardiothoracic Surgery

## 2018-11-20 LAB — SURGICAL PATHOLOGY

## 2018-11-22 ENCOUNTER — Telehealth: Payer: Self-pay

## 2018-11-22 NOTE — Telephone Encounter (Signed)
Per Dr.Oaks -call patient and let him know his results are negative for malignancies.   Patient was very appreciative of the call.

## 2018-11-27 ENCOUNTER — Encounter: Payer: Self-pay | Admitting: Cardiothoracic Surgery

## 2018-11-27 ENCOUNTER — Other Ambulatory Visit: Payer: Self-pay

## 2018-11-27 ENCOUNTER — Ambulatory Visit (INDEPENDENT_AMBULATORY_CARE_PROVIDER_SITE_OTHER): Payer: PPO | Admitting: Cardiothoracic Surgery

## 2018-11-27 VITALS — BP 124/74 | HR 86 | Temp 97.9°F | Resp 14 | Ht 66.0 in | Wt 143.0 lb

## 2018-11-27 DIAGNOSIS — R918 Other nonspecific abnormal finding of lung field: Secondary | ICD-10-CM

## 2018-11-27 NOTE — Progress Notes (Signed)
  Patient ID: Darin Williams, male   DOB: 1936/08/17, 82 y.o.   MRN: [id number]  HISTORY: He returns today in follow-up.  He tolerated his CT-guided needle biopsy without problems.  His only complaint is that he has a very mild cough.   Vitals:   11/27/18 1055  BP: 124/74  Pulse: 86  Resp: 14  Temp: 97.9 F (36.6 C)  SpO2: 98%     EXAM:    Resp: Lungs are clear bilaterally.  No respiratory distress, normal effort. Heart:  Regular without murmurs Abd:  Abdomen is soft, non distended and non tender. No masses are palpable.  There is no rebound and no guarding.  Neurological: Alert and oriented to person, place, and time. Coordination normal.  Skin: Skin is warm and dry. No rash noted. No diaphoretic. No erythema. No pallor.  Psychiatric: Normal mood and affect. Normal behavior. Judgment and thought content normal.    ASSESSMENT: I had a long discussion with him today regarding the results of the CT-guided needle biopsy.  This shows exuberant fibrotic reaction.  There is no sign of malignancy.   PLAN:   I explained him that I would like to see him back again in 3 months time with another CT of the chest.  He is agreeable to doing that.    Nestor Lewandowsky, MD

## 2018-11-27 NOTE — Patient Instructions (Signed)
Return in three months ct scan before visit.

## 2018-11-30 DIAGNOSIS — R972 Elevated prostate specific antigen [PSA]: Secondary | ICD-10-CM | POA: Diagnosis not present

## 2018-11-30 DIAGNOSIS — E7800 Pure hypercholesterolemia, unspecified: Secondary | ICD-10-CM | POA: Diagnosis not present

## 2018-11-30 DIAGNOSIS — R918 Other nonspecific abnormal finding of lung field: Secondary | ICD-10-CM | POA: Diagnosis not present

## 2018-11-30 DIAGNOSIS — M1A0720 Idiopathic chronic gout, left ankle and foot, without tophus (tophi): Secondary | ICD-10-CM | POA: Diagnosis not present

## 2018-11-30 DIAGNOSIS — I10 Essential (primary) hypertension: Secondary | ICD-10-CM | POA: Diagnosis not present

## 2018-11-30 DIAGNOSIS — J432 Centrilobular emphysema: Secondary | ICD-10-CM | POA: Diagnosis not present

## 2019-01-11 DIAGNOSIS — Z08 Encounter for follow-up examination after completed treatment for malignant neoplasm: Secondary | ICD-10-CM | POA: Diagnosis not present

## 2019-01-11 DIAGNOSIS — X32XXXA Exposure to sunlight, initial encounter: Secondary | ICD-10-CM | POA: Diagnosis not present

## 2019-01-11 DIAGNOSIS — L821 Other seborrheic keratosis: Secondary | ICD-10-CM | POA: Diagnosis not present

## 2019-01-11 DIAGNOSIS — Z85828 Personal history of other malignant neoplasm of skin: Secondary | ICD-10-CM | POA: Diagnosis not present

## 2019-01-11 DIAGNOSIS — L570 Actinic keratosis: Secondary | ICD-10-CM | POA: Diagnosis not present

## 2019-01-11 DIAGNOSIS — Z872 Personal history of diseases of the skin and subcutaneous tissue: Secondary | ICD-10-CM | POA: Diagnosis not present

## 2019-02-05 ENCOUNTER — Other Ambulatory Visit: Payer: Self-pay | Admitting: *Deleted

## 2019-02-05 DIAGNOSIS — R918 Other nonspecific abnormal finding of lung field: Secondary | ICD-10-CM

## 2019-02-05 DIAGNOSIS — R911 Solitary pulmonary nodule: Secondary | ICD-10-CM

## 2019-03-16 ENCOUNTER — Ambulatory Visit
Admission: RE | Admit: 2019-03-16 | Discharge: 2019-03-16 | Disposition: A | Discharge status: Home / Self Care | Payer: PPO | Source: Ambulatory Visit | Attending: Cardiothoracic Surgery | Admitting: Cardiothoracic Surgery

## 2019-03-16 ENCOUNTER — Other Ambulatory Visit: Payer: Self-pay

## 2019-03-16 ENCOUNTER — Telehealth: Payer: Self-pay

## 2019-03-16 DIAGNOSIS — R911 Solitary pulmonary nodule: Secondary | ICD-10-CM | POA: Diagnosis not present

## 2019-03-16 DIAGNOSIS — R918 Other nonspecific abnormal finding of lung field: Secondary | ICD-10-CM

## 2019-03-16 NOTE — Telephone Encounter (Signed)
Patient notified to have CT scan chest w/o contrast today @ outpatient imaging 2:30 and appointment with Dr.Oaks 03/19/2019 @ 9 am .

## 2019-03-19 ENCOUNTER — Ambulatory Visit (INDEPENDENT_AMBULATORY_CARE_PROVIDER_SITE_OTHER): Payer: PPO | Admitting: Cardiothoracic Surgery

## 2019-03-19 ENCOUNTER — Encounter: Payer: Self-pay | Admitting: Cardiothoracic Surgery

## 2019-03-19 ENCOUNTER — Other Ambulatory Visit: Payer: Self-pay

## 2019-03-19 VITALS — BP 152/89 | HR 53 | Temp 97.7°F | Resp 18 | Ht 67.0 in | Wt 144.8 lb

## 2019-03-19 DIAGNOSIS — R918 Other nonspecific abnormal finding of lung field: Secondary | ICD-10-CM

## 2019-03-19 NOTE — Patient Instructions (Signed)
We will schedule a CT chest scan in 6 months and follow up with the results in the office.

## 2019-03-19 NOTE — Progress Notes (Signed)
  Patient ID: Darin Williams, male   DOB: [birth year], 83 y.o.   MRN: [id number]  HISTORY: Mr. celani returns today in follow-up.  He has been followed for a extensive right upper lobe process.  He had a repeat CT scan done last week.  He states that his breathing's been quite good.  He has been able to sing and this is also improving.  He denied any fevers or chills.  He denied any hemoptysis.  He has gained a few pounds.   Vitals:   03/19/19 0858  BP: (!) 152/89  Pulse: (!) 53  Resp: 18  Temp: 97.7 F (36.5 C)  SpO2: 96%     EXAM:    Resp: Lungs are clear bilaterally but somewhat distant..  No respiratory distress, normal effort. Heart:  Regular without murmurs Abd:  Abdomen is soft, non distended and non tender. No masses are palpable.  There is no rebound and no guarding.  Neurological: Alert and oriented to person, place, and time. Coordination normal.  Skin: Skin is warm and dry. No rash noted. No diaphoretic. No erythema. No pallor.  Psychiatric: Normal mood and affect. Normal behavior. Judgment and thought content normal.    ASSESSMENT: He did have a chest CT made last week.  I have independently reviewed that and compared it to his prior scan.  There is extensive right upper lobe scarring which is improved compared to October 2019.  This was felt to be compatible with a resolving pneumonic process and not a malignancy.   PLAN:   I would like to see Mr. derrick back in 6 months with a noncontrast chest CT for further follow-up.  He is agreeable to that plan.    Nestor Lewandowsky, MD

## 2019-03-23 ENCOUNTER — Ambulatory Visit: Payer: PPO

## 2019-03-23 ENCOUNTER — Ambulatory Visit: Payer: PPO | Admitting: Cardiothoracic Surgery

## 2019-05-20 IMAGING — CT CT BIOPSY
2 of 3 series · 7 of 14 positions shown, 8 images · non-contrast
Comparison: PET scan on 11/02/2018 and CT of the chest on
10/16/2018.

CLINICAL DATA: Right upper lobe lung mass and history emphysema and
prior smoking. The patient presents for biopsy of a posterior right
upper lobe mass demonstrating increased metabolic activity by PET
scan.

EXAM:
CT GUIDED CORE BIOPSY OF RIGHT UPPER LOBE LUNG MASS

[Series 2: i-spiral 5.0 b30f · axial · 0.54mm/px · z∈[+1386,+1456]mm · 4 of 24 slices shown, 5 images]
[im 5/24  soft-tissue]
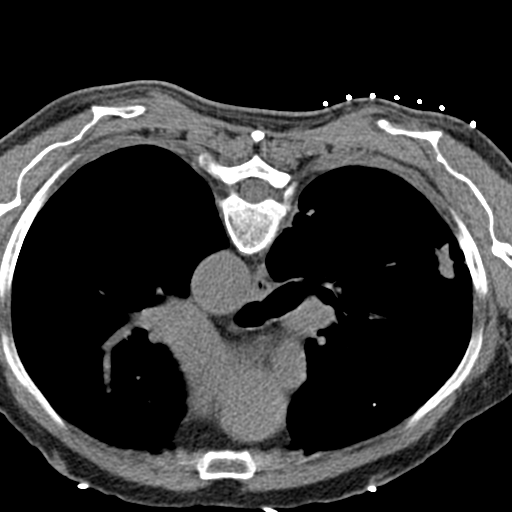
[im 5/24  bone]
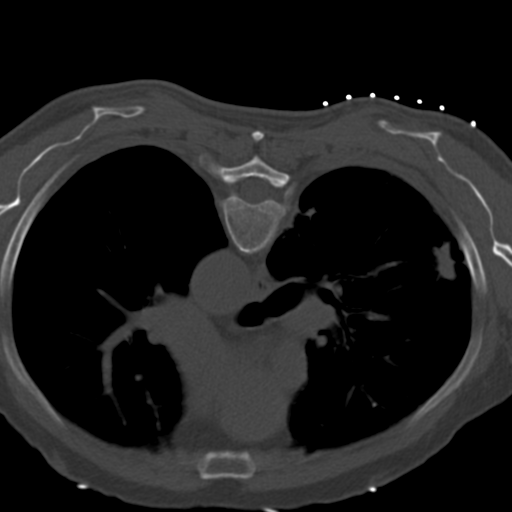
[im 10/24  bone]
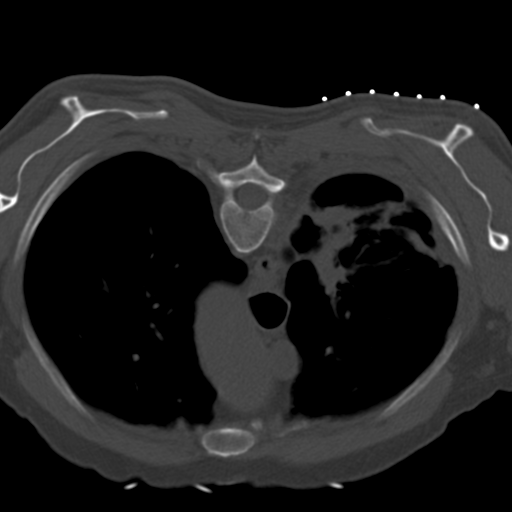
[im 14/24  bone]
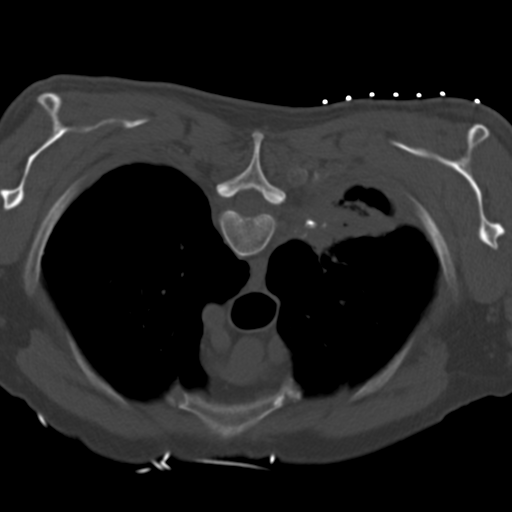
[im 19/24  bone]
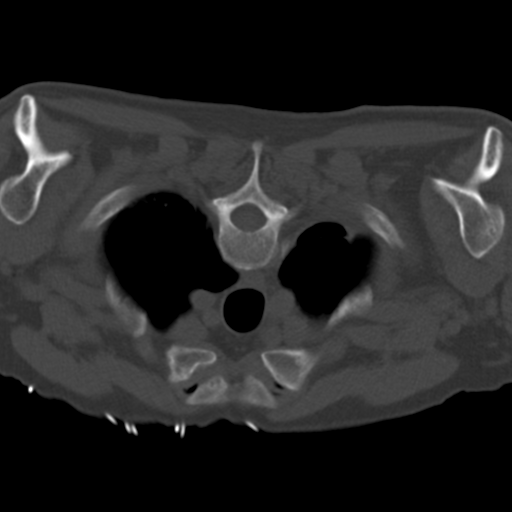

[Series 3: i-sequence 4.8 b30s · axial · 0.54mm/px · z∈[+1434,+1440]mm · 3 of 24 slices shown]
[im 6/24  bone]
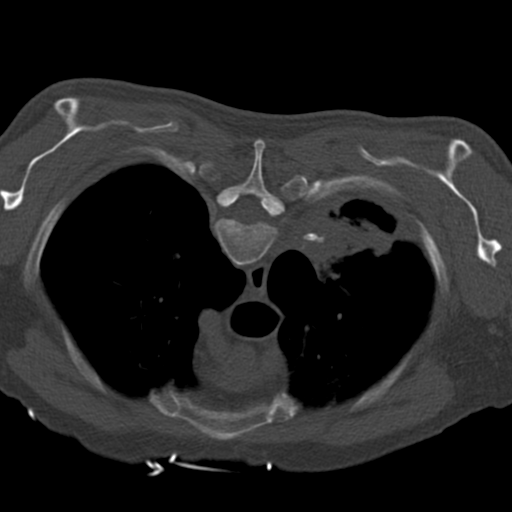
[im 12/24  bone]
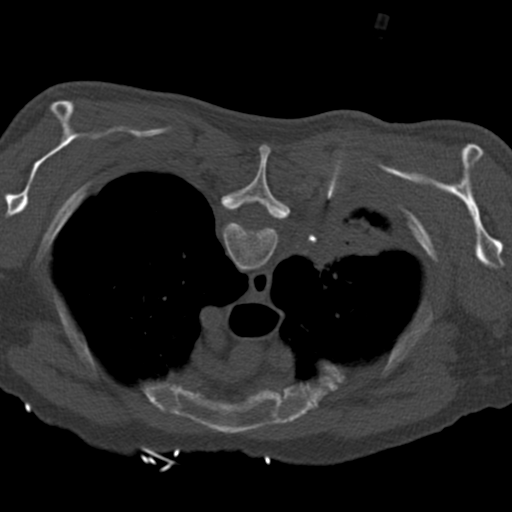
[im 18/24  bone]
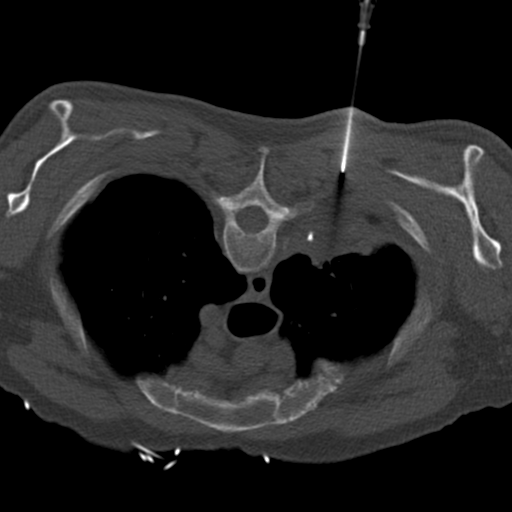

[7 of 14 positions shown; findings below may reference images not displayed]

ANESTHESIA/SEDATION:
2.0 mg IV Versed; 75 mcg IV Fentanyl

Total Moderate Sedation Time:  26 minutes.

The patient's level of consciousness and physiologic status were
continuously monitored during the procedure by Radiology nursing.

PROCEDURE:
The procedure risks, benefits, and alternatives were explained to
the patient. Questions regarding the procedure were encouraged and
answered. The patient understands and consents to the procedure. A
time-out was performed prior to initiating the procedure.

CT was performed through the upper to mid chest in a prone position.
The posterior right chest wall was prepped with chlorhexidine in a
sterile fashion, and a sterile drape was applied covering the
operative field. A sterile gown and sterile gloves were used for the
procedure. Local anesthesia was provided with 1% Lidocaine.

Under CT guidance, a 17 gauge trocar needle was advanced to the
level of a posterior right upper lobe lung mass. After confirming
needle tip position, coaxial 18 gauge core biopsy samples were
obtained. Three core biopsy samples were obtained and submitted in
formalin. Additional CT was performed prior to and following
deposition of a plug at the pleural entry site utilizing the
Biosentry device.

COMPLICATIONS:
None
FINDINGS: Mass-like area of the posterior right upper lobe again localized.
When combined with adjacent parenchymal abnormality, this area
measures as much as 6 cm in transverse diameter. The region
demonstrating the highest level of metabolic activity by PET near an
area of central calcification was targeted and sampled yielding
solid tissue. Post biopsy imaging shows no evidence of pneumothorax
or parenchymal hemorrhage. A follow-up chest x-ray will be obtained
during recovery.
IMPRESSION: CT-guided core biopsy performed of right upper lobe lung mass. The
entirety of parenchymal abnormality in the posterior right upper
lobe measures as much as 6 cm in transverse diameter.

## 2019-05-20 IMAGING — DX DG CHEST 1V PORT
1 series · 1 of 1 positions shown · non-contrast
Comparison: CT of the chest on 10/16/2018

CLINICAL DATA: Status post percutaneous biopsy of right upper lobe
lung mass.

EXAM:
PORTABLE CHEST 1 VIEW

[chest ap]
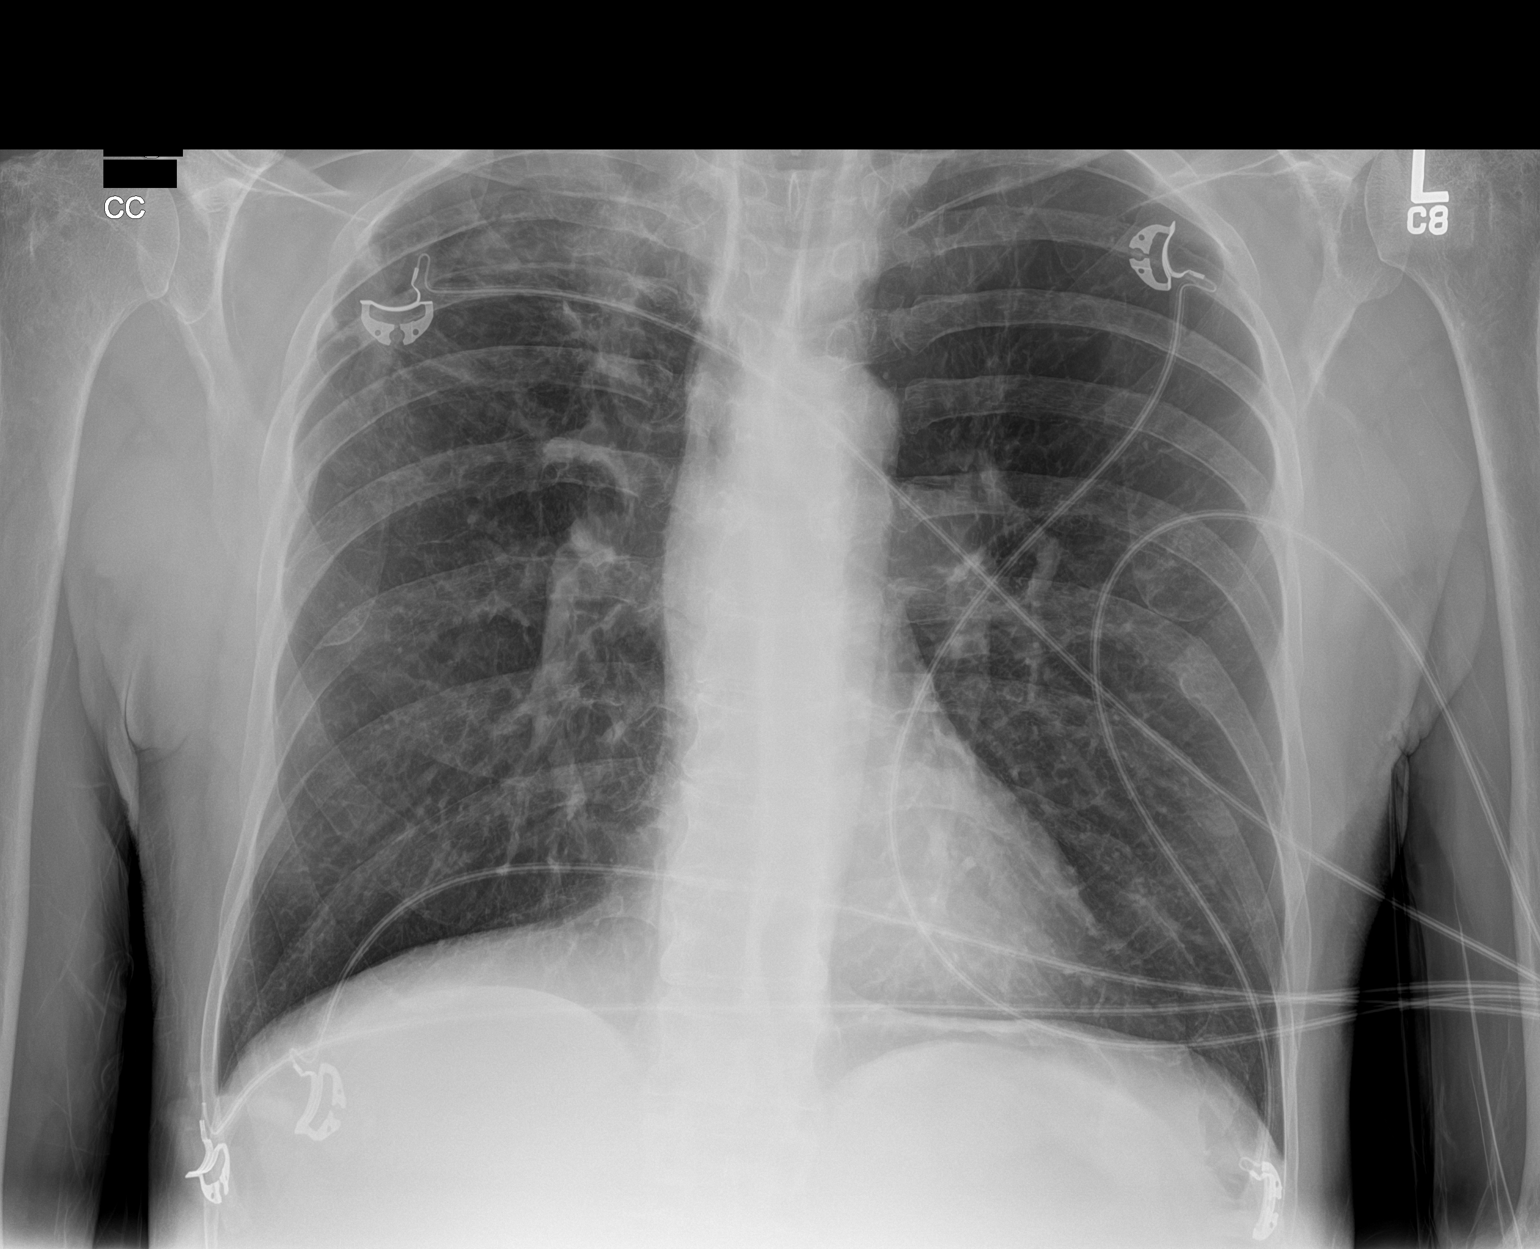

[1 of 1 positions shown; findings below may reference images not displayed]

FINDINGS: The heart size and mediastinal contours are within normal limits.
Stable emphysematous lung disease. No evidence of pneumothorax
following lung biopsy. Mass at the right lung apex again noted.
There is no evidence of pulmonary edema, consolidation or pleural
fluid. Old left-sided rib fractures.
IMPRESSION: No pneumothorax following right upper lobe lung mass biopsy.

## 2019-05-23 DIAGNOSIS — H2513 Age-related nuclear cataract, bilateral: Secondary | ICD-10-CM | POA: Diagnosis not present

## 2019-06-06 DIAGNOSIS — R972 Elevated prostate specific antigen [PSA]: Secondary | ICD-10-CM | POA: Diagnosis not present

## 2019-06-06 DIAGNOSIS — M1A0720 Idiopathic chronic gout, left ankle and foot, without tophus (tophi): Secondary | ICD-10-CM | POA: Diagnosis not present

## 2019-06-06 DIAGNOSIS — E7800 Pure hypercholesterolemia, unspecified: Secondary | ICD-10-CM | POA: Diagnosis not present

## 2019-06-06 DIAGNOSIS — I10 Essential (primary) hypertension: Secondary | ICD-10-CM | POA: Diagnosis not present

## 2019-06-06 DIAGNOSIS — J432 Centrilobular emphysema: Secondary | ICD-10-CM | POA: Diagnosis not present

## 2019-06-06 DIAGNOSIS — Z0000 Encounter for general adult medical examination without abnormal findings: Secondary | ICD-10-CM | POA: Diagnosis not present

## 2019-07-12 DIAGNOSIS — D2272 Melanocytic nevi of left lower limb, including hip: Secondary | ICD-10-CM | POA: Diagnosis not present

## 2019-07-12 DIAGNOSIS — Z85828 Personal history of other malignant neoplasm of skin: Secondary | ICD-10-CM | POA: Diagnosis not present

## 2019-07-12 DIAGNOSIS — D0462 Carcinoma in situ of skin of left upper limb, including shoulder: Secondary | ICD-10-CM | POA: Diagnosis not present

## 2019-07-12 DIAGNOSIS — L821 Other seborrheic keratosis: Secondary | ICD-10-CM | POA: Diagnosis not present

## 2019-07-12 DIAGNOSIS — L570 Actinic keratosis: Secondary | ICD-10-CM | POA: Diagnosis not present

## 2019-07-12 DIAGNOSIS — X32XXXA Exposure to sunlight, initial encounter: Secondary | ICD-10-CM | POA: Diagnosis not present

## 2019-07-12 DIAGNOSIS — D485 Neoplasm of uncertain behavior of skin: Secondary | ICD-10-CM | POA: Diagnosis not present

## 2019-07-12 DIAGNOSIS — D2262 Melanocytic nevi of left upper limb, including shoulder: Secondary | ICD-10-CM | POA: Diagnosis not present

## 2019-07-12 DIAGNOSIS — D2261 Melanocytic nevi of right upper limb, including shoulder: Secondary | ICD-10-CM | POA: Diagnosis not present

## 2019-08-16 ENCOUNTER — Other Ambulatory Visit: Payer: Self-pay

## 2019-08-16 DIAGNOSIS — R918 Other nonspecific abnormal finding of lung field: Secondary | ICD-10-CM

## 2019-08-16 DIAGNOSIS — D0462 Carcinoma in situ of skin of left upper limb, including shoulder: Secondary | ICD-10-CM | POA: Diagnosis not present

## 2019-08-16 DIAGNOSIS — L570 Actinic keratosis: Secondary | ICD-10-CM | POA: Diagnosis not present

## 2019-09-10 ENCOUNTER — Ambulatory Visit
Admission: RE | Admit: 2019-09-10 | Discharge: 2019-09-10 | Disposition: A | Discharge status: Home / Self Care | Payer: PPO | Source: Ambulatory Visit | Attending: Cardiothoracic Surgery | Admitting: Cardiothoracic Surgery

## 2019-09-10 ENCOUNTER — Other Ambulatory Visit: Payer: Self-pay

## 2019-09-10 DIAGNOSIS — R918 Other nonspecific abnormal finding of lung field: Secondary | ICD-10-CM | POA: Diagnosis not present

## 2019-09-21 ENCOUNTER — Ambulatory Visit: Payer: PPO | Admitting: Cardiothoracic Surgery

## 2019-09-22 DIAGNOSIS — J441 Chronic obstructive pulmonary disease with (acute) exacerbation: Secondary | ICD-10-CM | POA: Diagnosis not present

## 2019-09-22 DIAGNOSIS — J029 Acute pharyngitis, unspecified: Secondary | ICD-10-CM | POA: Diagnosis not present

## 2019-09-24 DIAGNOSIS — J441 Chronic obstructive pulmonary disease with (acute) exacerbation: Secondary | ICD-10-CM | POA: Diagnosis not present

## 2019-09-24 DIAGNOSIS — J209 Acute bronchitis, unspecified: Secondary | ICD-10-CM | POA: Diagnosis not present

## 2019-09-24 DIAGNOSIS — B9689 Other specified bacterial agents as the cause of diseases classified elsewhere: Secondary | ICD-10-CM | POA: Diagnosis not present

## 2019-09-24 DIAGNOSIS — J019 Acute sinusitis, unspecified: Secondary | ICD-10-CM | POA: Diagnosis not present

## 2019-09-27 ENCOUNTER — Encounter: Payer: Self-pay | Admitting: Cardiothoracic Surgery

## 2019-09-27 ENCOUNTER — Ambulatory Visit: Payer: PPO | Admitting: Cardiothoracic Surgery

## 2019-09-27 ENCOUNTER — Other Ambulatory Visit: Payer: Self-pay

## 2019-09-27 VITALS — BP 160/85 | HR 60 | Temp 97.6°F | Ht 67.0 in | Wt 139.6 lb

## 2019-09-27 DIAGNOSIS — R918 Other nonspecific abnormal finding of lung field: Secondary | ICD-10-CM

## 2019-09-27 NOTE — Patient Instructions (Signed)
Follow up in one year with a CT Chest scan prior to office visit.

## 2019-09-27 NOTE — Progress Notes (Signed)
  Patient ID: Darin Williams, male   DOB: 06/02/1936, 83 y.o.   MRN: [id number]  HISTORY: Darin Williams returns today in follow-up.  He states that about a week ago he started developing a cough with occasional sputum production which is clear.  He saw his primary care physician was placed on doxycycline and oral prednisone taper.  He states that he felt much better after a few days and now feels almost completely back to baseline.  Otherwise he has been getting along okay without any intercurrent problems.   Vitals:   09/27/19 0844  BP: (!) 160/85  Pulse: 60  Temp: 97.6 F (36.4 C)  SpO2: 96%     EXAM:    Resp: Lungs are clear bilaterally but are quite distant overall.  No respiratory distress, normal effort. Heart:  Regular without murmurs Abd:  Abdomen is soft, non distended and non tender. No masses are palpable.  There is no rebound and no guarding.  Neurological: Alert and oriented to person, place, and time. Coordination normal.  Skin: Skin is warm and dry. No rash noted. No diaphoretic. No erythema. No pallor.  Psychiatric: Normal mood and affect. Normal behavior. Judgment and thought content normal.    ASSESSMENT: Abnormal chest CT with biapical scarring   PLAN:   I have independently reviewed the patient's CT scan.  I have compared to all the prior CT scans.  The most recent scan performed 2 weeks ago shows improvement compared to a year ago and is unchanged compared to the scan in March.  I would like to bring him back again in 1 year for continued surveillance.  He is agreeable to doing that.    Nestor Lewandowsky, MD

## 2019-10-31 DIAGNOSIS — J019 Acute sinusitis, unspecified: Secondary | ICD-10-CM | POA: Diagnosis not present

## 2019-10-31 DIAGNOSIS — B9689 Other specified bacterial agents as the cause of diseases classified elsewhere: Secondary | ICD-10-CM | POA: Diagnosis not present

## 2019-10-31 DIAGNOSIS — R05 Cough: Secondary | ICD-10-CM | POA: Diagnosis not present

## 2019-10-31 DIAGNOSIS — J209 Acute bronchitis, unspecified: Secondary | ICD-10-CM | POA: Diagnosis not present

## 2019-12-12 ENCOUNTER — Telehealth: Payer: Self-pay | Admitting: *Deleted

## 2019-12-12 NOTE — Telephone Encounter (Signed)
Patient was instructed to contact his primary care physician.

## 2019-12-12 NOTE — Telephone Encounter (Signed)
Patient called and wants Dr.Oaks advise on if he should or should not take the COVID vaccine when its available to him.

## 2019-12-13 DIAGNOSIS — I10 Essential (primary) hypertension: Secondary | ICD-10-CM | POA: Diagnosis not present

## 2019-12-13 DIAGNOSIS — M1A0720 Idiopathic chronic gout, left ankle and foot, without tophus (tophi): Secondary | ICD-10-CM | POA: Diagnosis not present

## 2019-12-13 DIAGNOSIS — J432 Centrilobular emphysema: Secondary | ICD-10-CM | POA: Diagnosis not present

## 2019-12-13 DIAGNOSIS — E7800 Pure hypercholesterolemia, unspecified: Secondary | ICD-10-CM | POA: Diagnosis not present

## 2019-12-13 DIAGNOSIS — R972 Elevated prostate specific antigen [PSA]: Secondary | ICD-10-CM | POA: Diagnosis not present

## 2019-12-19 DIAGNOSIS — D2261 Melanocytic nevi of right upper limb, including shoulder: Secondary | ICD-10-CM | POA: Diagnosis not present

## 2019-12-19 DIAGNOSIS — D2271 Melanocytic nevi of right lower limb, including hip: Secondary | ICD-10-CM | POA: Diagnosis not present

## 2019-12-19 DIAGNOSIS — L570 Actinic keratosis: Secondary | ICD-10-CM | POA: Diagnosis not present

## 2019-12-19 DIAGNOSIS — C44222 Squamous cell carcinoma of skin of right ear and external auricular canal: Secondary | ICD-10-CM | POA: Diagnosis not present

## 2019-12-19 DIAGNOSIS — D2262 Melanocytic nevi of left upper limb, including shoulder: Secondary | ICD-10-CM | POA: Diagnosis not present

## 2019-12-19 DIAGNOSIS — R208 Other disturbances of skin sensation: Secondary | ICD-10-CM | POA: Diagnosis not present

## 2019-12-19 DIAGNOSIS — D485 Neoplasm of uncertain behavior of skin: Secondary | ICD-10-CM | POA: Diagnosis not present

## 2019-12-19 DIAGNOSIS — D225 Melanocytic nevi of trunk: Secondary | ICD-10-CM | POA: Diagnosis not present

## 2019-12-19 DIAGNOSIS — D2272 Melanocytic nevi of left lower limb, including hip: Secondary | ICD-10-CM | POA: Diagnosis not present

## 2020-01-22 DIAGNOSIS — C44222 Squamous cell carcinoma of skin of right ear and external auricular canal: Secondary | ICD-10-CM | POA: Diagnosis not present

## 2020-05-23 DIAGNOSIS — H2513 Age-related nuclear cataract, bilateral: Secondary | ICD-10-CM | POA: Diagnosis not present

## 2020-07-01 DIAGNOSIS — R972 Elevated prostate specific antigen [PSA]: Secondary | ICD-10-CM | POA: Diagnosis not present

## 2020-07-01 DIAGNOSIS — I10 Essential (primary) hypertension: Secondary | ICD-10-CM | POA: Diagnosis not present

## 2020-07-01 DIAGNOSIS — Z0000 Encounter for general adult medical examination without abnormal findings: Secondary | ICD-10-CM | POA: Diagnosis not present

## 2020-07-01 DIAGNOSIS — M1A0720 Idiopathic chronic gout, left ankle and foot, without tophus (tophi): Secondary | ICD-10-CM | POA: Diagnosis not present

## 2020-07-01 DIAGNOSIS — R918 Other nonspecific abnormal finding of lung field: Secondary | ICD-10-CM | POA: Diagnosis not present

## 2020-07-01 DIAGNOSIS — J432 Centrilobular emphysema: Secondary | ICD-10-CM | POA: Diagnosis not present

## 2020-07-01 DIAGNOSIS — E7800 Pure hypercholesterolemia, unspecified: Secondary | ICD-10-CM | POA: Diagnosis not present

## 2020-07-30 ENCOUNTER — Other Ambulatory Visit: Payer: Self-pay

## 2020-07-30 DIAGNOSIS — R918 Other nonspecific abnormal finding of lung field: Secondary | ICD-10-CM

## 2020-08-20 DIAGNOSIS — L570 Actinic keratosis: Secondary | ICD-10-CM | POA: Diagnosis not present

## 2020-08-20 DIAGNOSIS — D225 Melanocytic nevi of trunk: Secondary | ICD-10-CM | POA: Diagnosis not present

## 2020-08-20 DIAGNOSIS — D485 Neoplasm of uncertain behavior of skin: Secondary | ICD-10-CM | POA: Diagnosis not present

## 2020-08-20 DIAGNOSIS — X32XXXA Exposure to sunlight, initial encounter: Secondary | ICD-10-CM | POA: Diagnosis not present

## 2020-08-20 DIAGNOSIS — Z85828 Personal history of other malignant neoplasm of skin: Secondary | ICD-10-CM | POA: Diagnosis not present

## 2020-08-20 DIAGNOSIS — S80861A Insect bite (nonvenomous), right lower leg, initial encounter: Secondary | ICD-10-CM | POA: Diagnosis not present

## 2020-08-20 DIAGNOSIS — S80862A Insect bite (nonvenomous), left lower leg, initial encounter: Secondary | ICD-10-CM | POA: Diagnosis not present

## 2020-08-20 DIAGNOSIS — D0421 Carcinoma in situ of skin of right ear and external auricular canal: Secondary | ICD-10-CM | POA: Diagnosis not present

## 2020-08-20 DIAGNOSIS — D0439 Carcinoma in situ of skin of other parts of face: Secondary | ICD-10-CM | POA: Diagnosis not present

## 2020-08-20 DIAGNOSIS — D2262 Melanocytic nevi of left upper limb, including shoulder: Secondary | ICD-10-CM | POA: Diagnosis not present

## 2020-08-20 DIAGNOSIS — L821 Other seborrheic keratosis: Secondary | ICD-10-CM | POA: Diagnosis not present

## 2020-08-20 DIAGNOSIS — D2271 Melanocytic nevi of right lower limb, including hip: Secondary | ICD-10-CM | POA: Diagnosis not present

## 2020-08-20 DIAGNOSIS — D2261 Melanocytic nevi of right upper limb, including shoulder: Secondary | ICD-10-CM | POA: Diagnosis not present

## 2020-09-11 ENCOUNTER — Ambulatory Visit
Admission: RE | Admit: 2020-09-11 | Discharge: 2020-09-11 | Disposition: A | Discharge status: Home / Self Care | Payer: PPO | Source: Ambulatory Visit | Attending: Cardiothoracic Surgery | Admitting: Cardiothoracic Surgery

## 2020-09-11 ENCOUNTER — Other Ambulatory Visit: Payer: Self-pay

## 2020-09-11 DIAGNOSIS — I2510 Atherosclerotic heart disease of native coronary artery without angina pectoris: Secondary | ICD-10-CM | POA: Diagnosis not present

## 2020-09-11 DIAGNOSIS — R918 Other nonspecific abnormal finding of lung field: Secondary | ICD-10-CM | POA: Diagnosis not present

## 2020-09-11 DIAGNOSIS — J439 Emphysema, unspecified: Secondary | ICD-10-CM | POA: Diagnosis not present

## 2020-09-11 DIAGNOSIS — I700 Atherosclerosis of aorta: Secondary | ICD-10-CM | POA: Diagnosis not present

## 2020-09-11 DIAGNOSIS — J984 Other disorders of lung: Secondary | ICD-10-CM | POA: Diagnosis not present

## 2020-09-22 ENCOUNTER — Ambulatory Visit: Payer: PPO | Admitting: Cardiothoracic Surgery

## 2020-09-23 ENCOUNTER — Ambulatory Visit: Payer: PPO | Admitting: Cardiothoracic Surgery

## 2020-09-29 DIAGNOSIS — C44329 Squamous cell carcinoma of skin of other parts of face: Secondary | ICD-10-CM | POA: Diagnosis not present

## 2020-09-29 DIAGNOSIS — D0439 Carcinoma in situ of skin of other parts of face: Secondary | ICD-10-CM | POA: Diagnosis not present

## 2020-10-03 ENCOUNTER — Ambulatory Visit (INDEPENDENT_AMBULATORY_CARE_PROVIDER_SITE_OTHER): Payer: PPO | Admitting: Cardiothoracic Surgery

## 2020-10-03 ENCOUNTER — Other Ambulatory Visit: Payer: Self-pay

## 2020-10-03 ENCOUNTER — Encounter: Payer: Self-pay | Admitting: Cardiothoracic Surgery

## 2020-10-03 VITALS — BP 161/90 | HR 65 | Temp 97.0°F | Resp 12 | Ht 68.0 in | Wt 137.0 lb

## 2020-10-03 DIAGNOSIS — N281 Cyst of kidney, acquired: Secondary | ICD-10-CM | POA: Diagnosis not present

## 2020-10-03 DIAGNOSIS — R918 Other nonspecific abnormal finding of lung field: Secondary | ICD-10-CM | POA: Diagnosis not present

## 2020-10-03 NOTE — Patient Instructions (Addendum)
Please call our office if you have any questions or concerns.   Our office will call you July  to schedule CT scan for August 2022.    Referral faxed to [place] at [facility]. Someone form their office will call to schedule an appointment within 5-7 days . If you do not hear from their office please call our office so we can check on this for you. [phone]

## 2020-10-03 NOTE — Progress Notes (Signed)
Darin Williams Follow Up Note  Patient ID: Darin Williams, male   DOB: [birth year], 84 y.o.   MRN: [id number]  HISTORY: Darin Williams returns today in follow-up.  He did have a CT scan made.  Have independently reviewed that.  He states that he has been getting along quite well.  He does not complain of any significant shortness of breath other than what is age-appropriate.    Vitals:   10/03/20 0847  BP: (!) 161/90  Pulse: 65  Resp: 12  Temp: (!) 97 F (36.1 C)  SpO2: 98%     EXAM:  Resp: Lungs are clear bilaterally.  No respiratory distress, normal effort. Heart:  Regular without murmurs Abd:  Abdomen is soft, non distended and non tender. No masses are palpable.  There is no rebound and no guarding.  Neurological: Alert and oriented to person, place, and time. Coordination normal.  Skin: Skin is warm and dry. No rash noted. No diaphoretic. No erythema. No pallor.  Psychiatric: Normal mood and affect. Normal behavior. Judgment and thought content normal.      ASSESSMENT: I have independently reviewed his chest CT and compared to his prior scans.  The pleural-parenchymal scarring in the right apex of the lung is essentially unchanged.  There is no other findings in the lungs to suggest a malignancy.  He does however have 2 very large cysts in the kidneys right greater than left.    PLAN:   I have made the recommendation that he follow-up with [place] again in 1 year.  We will repeat the chest CT at that time.  We will also ask our urologist to see the patient regarding his kidney cyst.    Nestor Lewandowsky, MD

## 2020-10-06 DIAGNOSIS — D0421 Carcinoma in situ of skin of right ear and external auricular canal: Secondary | ICD-10-CM | POA: Diagnosis not present

## 2020-12-02 ENCOUNTER — Other Ambulatory Visit: Payer: Self-pay

## 2020-12-02 ENCOUNTER — Encounter: Payer: Self-pay | Admitting: Urology

## 2020-12-02 ENCOUNTER — Ambulatory Visit: Payer: PPO | Admitting: Urology

## 2020-12-02 VITALS — BP 149/84 | HR 61 | Ht 68.0 in | Wt 138.0 lb

## 2020-12-02 DIAGNOSIS — R3912 Poor urinary stream: Secondary | ICD-10-CM | POA: Diagnosis not present

## 2020-12-02 DIAGNOSIS — R8290 Unspecified abnormal findings in urine: Secondary | ICD-10-CM

## 2020-12-02 DIAGNOSIS — N281 Cyst of kidney, acquired: Secondary | ICD-10-CM | POA: Diagnosis not present

## 2020-12-02 LAB — URINALYSIS, COMPLETE
Bilirubin, UA: NEGATIVE
Glucose, UA: NEGATIVE
Ketones, UA: NEGATIVE
Leukocytes,UA: NEGATIVE
Nitrite, UA: NEGATIVE
Protein,UA: NEGATIVE
RBC, UA: NEGATIVE
Specific Gravity, UA: 1.015 (ref 1.005–1.030)
Urobilinogen, Ur: 0.2 mg/dL (ref 0.2–1.0)
pH, UA: 5.5 (ref 5.0–7.5)

## 2020-12-02 LAB — MICROSCOPIC EXAMINATION
Bacteria, UA: NONE SEEN
RBC: NONE SEEN /hpf (ref 0–2)

## 2020-12-02 NOTE — Progress Notes (Signed)
12/02/2020 8:38 AM   Darin Williams October 08, 1936 [id number]  Referring provider: Nestor Lewandowsky, MD [address] [place],  [place] [address]  Chief Complaint  Patient presents with  . Other    HPI: 84 year old male who presents today for further evaluation of incidental renal cyst.  He was seen and evaluated by Marta Lamas for some small pulmonary nodules.  Incidentally, large right renal cyst (~ 9 cm) was partially imaged.  He presents today for further evaluation of this.  He has no additional cross-sectional imaging.  He denies any flank pain or gross hematuria.  He also is concerned today that he might have a urinary tract infection.  He has noticed for the last several months that his urine has been somewhat foul-smelling at times.  This is what is more concentrated.  He denies any dysuria, urgency or frequency.   He does mention over the past several years, he has had a slightly slower/weaker urinary stream and occasionally some hesitancy with difficulty getting his stream started.  This is not severe and not bothersome.  He takes no BPH medications.  He is not interested in treatment for this at this time.   PMH: Past Medical History:  Diagnosis Date  . COPD (chronic obstructive pulmonary disease) ([place])   . Gout   . Hypertension   . Skin cancer     Surgical History: Past Surgical History:  Procedure Laterality Date  . EXTERNAL EAR SURGERY    . mohl's surgery      Home Medications:  Allergies as of 12/02/2020   No Known Allergies     Medication List       Accurate as of December 02, 2020  8:38 AM. If you have any questions, ask your nurse or doctor.        Afluria Quadrivalent injection Generic drug: influenza vac split quadrivalent   albuterol 108 (90 Base) MCG/ACT inhaler Commonly known as: VENTOLIN HFA Inhale into the lungs.   amLODipine 5 MG tablet Commonly known as: NORVASC Take by mouth.   cetirizine 10 MG tablet Commonly known as:  ZYRTEC Take by mouth.   lisinopril 20 MG tablet Commonly known as: ZESTRIL Take by mouth.   simvastatin 20 MG tablet Commonly known as: ZOCOR Take by mouth.   Spiriva HandiHaler 18 MCG inhalation capsule Generic drug: tiotropium PLACE ONE CAPSULE INTO INHALER AND INHALE ONCE DAILY       Allergies: No Known Allergies  Family History: Family History  Problem Relation Age of Onset  . Dementia Mother   . Lung cancer Father   . Lung cancer Paternal Uncle   . Lung cancer Paternal Grandfather     Social History:  reports that he quit smoking about 16 years ago. He has never used smokeless tobacco. He reports that he does not drink alcohol and does not use drugs.   Physical Exam: BP (!) 149/84   Pulse 61   Ht 5\' 8"  (1.727 m)   Wt 138 lb (62.6 kg)   BMI 20.98 kg/m   Constitutional:  Alert and oriented, No acute distress.  Accompanied by wife today HEENT: [place] AT, moist mucus membranes.  Trachea midline, no masses. Cardiovascular: No clubbing, cyanosis, or edema. Respiratory: Normal respiratory effort, no increased work of breathing. GI: Abdomen is soft, nontender, nondistended, no abdominal masses GU: No CVA tenderness Skin: No rashes, bruises or suspicious lesions. Neurologic: Grossly intact, no focal deficits, moving all 4 extremities. Psychiatric: Normal mood and affect.  Urinalysis UA today reviewed, negative  Pertinent Imaging: CT of the chest on 09/11/2020 personally reviewed.  Large what appears to be probably simple renal cyst, upper pole.   Assessment & Plan:    1. Renal cyst Probable simple right renal cyst, asymptomatic  Given size, recommend renal ultrasound to ensure that there is no complexity or any other features that warrant evaluation.  If this is a simple renal cyst, no further follow-up needed.  - Urinalysis, Complete - Ultrasound renal complete; Future  2. Foul smelling urine UA today is negative, suspect this from concentrated urine or  dietary factors  3. Weak urinary stream Suspect mild BPH, discussed possible treatments including meds versus intervention  Minimally bothered at this point in time, will return if symptoms worsen   Hollice Espy, MD  [facility] [address], [place], [place] [address] [phone]

## 2020-12-02 NOTE — Patient Instructions (Signed)
Will call with results

## 2020-12-16 ENCOUNTER — Other Ambulatory Visit: Payer: Self-pay

## 2020-12-16 ENCOUNTER — Ambulatory Visit
Admission: RE | Admit: 2020-12-16 | Discharge: 2020-12-16 | Disposition: A | Discharge status: Home / Self Care | Payer: PPO | Source: Ambulatory Visit | Attending: Urology | Admitting: Urology

## 2020-12-16 DIAGNOSIS — N281 Cyst of kidney, acquired: Secondary | ICD-10-CM | POA: Insufficient documentation

## 2020-12-16 DIAGNOSIS — N1330 Unspecified hydronephrosis: Secondary | ICD-10-CM | POA: Diagnosis not present

## 2021-01-05 ENCOUNTER — Telehealth: Payer: Self-pay | Admitting: Urology

## 2021-01-05 NOTE — Telephone Encounter (Signed)
Patient called asking about his RUS results from Dec. I saw that Dr. Erlene Quan did see them and so I read him the message and made him a follow up appt to come in and see her.  Thanks, Sharyn Lull

## 2021-01-07 DIAGNOSIS — J432 Centrilobular emphysema: Secondary | ICD-10-CM | POA: Diagnosis not present

## 2021-01-07 DIAGNOSIS — R972 Elevated prostate specific antigen [PSA]: Secondary | ICD-10-CM | POA: Diagnosis not present

## 2021-01-07 DIAGNOSIS — N1831 Chronic kidney disease, stage 3a: Secondary | ICD-10-CM | POA: Diagnosis not present

## 2021-01-07 DIAGNOSIS — E7800 Pure hypercholesterolemia, unspecified: Secondary | ICD-10-CM | POA: Diagnosis not present

## 2021-01-07 DIAGNOSIS — R918 Other nonspecific abnormal finding of lung field: Secondary | ICD-10-CM | POA: Diagnosis not present

## 2021-01-07 DIAGNOSIS — M1A0720 Idiopathic chronic gout, left ankle and foot, without tophus (tophi): Secondary | ICD-10-CM | POA: Diagnosis not present

## 2021-01-07 DIAGNOSIS — I10 Essential (primary) hypertension: Secondary | ICD-10-CM | POA: Diagnosis not present

## 2021-01-14 ENCOUNTER — Encounter: Payer: Self-pay | Admitting: Urology

## 2021-01-14 ENCOUNTER — Ambulatory Visit: Payer: PPO | Admitting: Urology

## 2021-01-14 ENCOUNTER — Other Ambulatory Visit: Payer: Self-pay

## 2021-01-14 VITALS — BP 145/89 | HR 85

## 2021-01-14 DIAGNOSIS — N1330 Unspecified hydronephrosis: Secondary | ICD-10-CM

## 2021-01-14 DIAGNOSIS — N281 Cyst of kidney, acquired: Secondary | ICD-10-CM | POA: Diagnosis not present

## 2021-01-16 NOTE — Progress Notes (Signed)
01/14/2021 2:53 PM   Darin Williams Jan 30, 1936 [id number]  Referring provider: Sofie Hartigan, [place],  [place] [address]  Chief Complaint  Patient presents with   Results    HPI: 85 year old male initially referred for further evaluation of right renal cyst who presents today to discuss incidental findings of hydronephrosis.  He was initially referred for incidental renal cysts.  He underwent more definitive evaluation of these in the form of renal ultrasound on 12/17/2019.  This showed bilateral simple renal cyst measuring up to 11 cm on the right.  This also indicated incidental mild right hydronephrosis which did not improve with bladder emptying.  Review of previous imaging in the form of PET scan back in 2019 revealed similar size renal cyst.  He did have marked atrophy of the left kidney at the time.  There is no appreciable hydronephrosis noted.  He denies any flank pain or history of kidney stones.  Urinalysis at initial consultation was negative.  Most recent creatinine 1.2 on 12/2020 which appears to be relatively stable since at least 2019.   PMH: Past Medical History:  Diagnosis Date   COPD (chronic obstructive pulmonary disease) ([place])    Gout    Hypertension    Skin cancer     Surgical History: Past Surgical History:  Procedure Laterality Date   EXTERNAL EAR SURGERY     mohl's surgery      Home Medications:  Allergies as of 01/14/2021   No Known Allergies     Medication List       Accurate as of January 14, 2021 11:59 PM. If you have any questions, ask your nurse or doctor.        Afluria Quadrivalent injection Generic drug: influenza vac split quadrivalent   albuterol 108 (90 Base) MCG/ACT inhaler Commonly known as: VENTOLIN HFA Inhale into the lungs.   amLODipine 5 MG tablet Commonly known as: NORVASC Take by mouth.   cetirizine 10 MG tablet Commonly known as: ZYRTEC Take by mouth.   lisinopril 20 MG  tablet Commonly known as: ZESTRIL Take by mouth.   simvastatin 20 MG tablet Commonly known as: ZOCOR Take by mouth.   Spiriva HandiHaler 18 MCG inhalation capsule Generic drug: tiotropium PLACE ONE CAPSULE INTO INHALER AND INHALE ONCE DAILY       Allergies: No Known Allergies  Family History: Family History  Problem Relation Age of Onset   Dementia Mother    Lung cancer Father    Lung cancer Paternal Uncle    Lung cancer Paternal Grandfather     Social History:  reports that he quit smoking about 17 years ago. He has never used smokeless tobacco. He reports that he does not drink alcohol and does not use drugs.   Physical Exam: BP (!) 145/89    Pulse 85   Constitutional:  Alert and oriented, No acute distress. HEENT:  AT, moist mucus membranes.  Trachea midline, no masses. Cardiovascular: No clubbing, cyanosis, or edema. Respiratory: Normal respiratory effort, no increased work of breathing. Skin: No rashes, bruises or suspicious lesions. Neurologic: Grossly intact, no focal deficits, moving all 4 extremities. Psychiatric: Normal mood and affect.  Pertinent Imaging  Ultrasound renal complete  Narrative CLINICAL DATA:  85 year old male with renal cysts.  EXAM: RENAL / URINARY TRACT ULTRASOUND COMPLETE  COMPARISON:  PET CT dated 11/02/2018.  FINDINGS: Right Kidney:  Renal measurements: 11.9 x 6.5 x 4.3 cm = volume: 173 mL. Normal echogenicity. There is mild right  hydronephrosis. No significant improvement postvoid. No shadowing stone. Multiple cysts with the largest measuring 11 x 10 x 9 cm in the upper pole. Evaluation of the cyst is limited by ultrasound due to large size.  Left Kidney:  Renal measurements: 6.5 x 3.9 x 3.0 cm = volume: 39 mL. Mild parenchyma atrophy. No hydronephrosis or shadowing stone. There is a 4 cm upper pole cyst.  Bladder:  Appears normal for degree of bladder distention.  Other:  None.  IMPRESSION: 1. Mild right  hydronephrosis. 2. No shadowing stone. 3. Bilateral renal cysts.   Electronically Signed By: Anner Crete M.D. On: 12/16/2020 22:38  Renal ultrasound was personally reviewed today.  This is also compared to his PET scan from 2019.  Assessment & Plan:    1. Renal cyst Large simple renal cyst bilaterally, right greater than left up to 11 cm  He is asymptomatic from these.  As such, would not recommend any further intervention.  If he does start having symptoms, would recommend consideration of laparoscopic cyst decortication.  No concern for malignancy.  No surveillance needed. - CT HEMATURIA WORKUP; Future  2. Hydronephrosis of right kidney Incidental right hydronephrosis of unclear etiology not previously appreciated on PET scan 2019  We discussed the differential diagnosis for hydronephrosis including intraluminal or extraluminal obstruction versus congenital versus reflux.  We discussed various options for further evaluation including CT urogram versus cystoscopy with bilateral retrograde.  He would like to opt for the least invasive CT urogram.  We will have him get the study and follow-up with me with the results.  Hollice Espy, MD  [facility] [address], [place], [place] [address] [phone]  I spent 30 total minutes on the day of the encounter including pre-visit review of the medical record, face-to-face time with the patient, and post visit ordering of labs/imaging/tests.

## 2021-02-04 ENCOUNTER — Other Ambulatory Visit: Payer: Self-pay

## 2021-02-04 ENCOUNTER — Ambulatory Visit
Admission: RE | Admit: 2021-02-04 | Discharge: 2021-02-04 | Disposition: A | Discharge status: Home / Self Care | Payer: PPO | Source: Ambulatory Visit | Attending: Urology | Admitting: Urology

## 2021-02-04 DIAGNOSIS — K8020 Calculus of gallbladder without cholecystitis without obstruction: Secondary | ICD-10-CM | POA: Diagnosis not present

## 2021-02-04 DIAGNOSIS — N261 Atrophy of kidney (terminal): Secondary | ICD-10-CM | POA: Diagnosis not present

## 2021-02-04 DIAGNOSIS — N281 Cyst of kidney, acquired: Secondary | ICD-10-CM | POA: Insufficient documentation

## 2021-02-04 DIAGNOSIS — K5730 Diverticulosis of large intestine without perforation or abscess without bleeding: Secondary | ICD-10-CM | POA: Diagnosis not present

## 2021-02-04 MED ORDER — IOHEXOL 300 MG/ML  SOLN
125.0000 mL | Freq: Once | INTRAMUSCULAR | Status: AC | PRN
  Administered 2021-02-04: 125 mL via INTRAVENOUS

## 2021-02-06 ENCOUNTER — Telehealth: Payer: Self-pay | Admitting: *Deleted

## 2021-02-06 NOTE — Telephone Encounter (Addendum)
Unable to leave VM  ----- Message from Hollice Espy, MD sent at 02/06/2021 10:04 AM EST ----- Please let this patient know that I just reviewed his CT scan.  The renal ultrasound previously indicated that he may have some hydronephrosis or swelling of his kidneys.  The CT scan indicates in fact that this is not the case.  He has a few cysts near the middle of his kidney which was mistaken for swelling/hydronephrosis when in fact it was never there in the first place.  This is very good news.  If you would like to come into the office of the schedule followup next week to discuss and happy to see him.  If he is otherwise happy with this explanation or is no other questions, we can just cancel his follow-up.    Hollice Espy, MD

## 2021-02-10 ENCOUNTER — Other Ambulatory Visit: Payer: Self-pay

## 2021-02-10 ENCOUNTER — Ambulatory Visit (INDEPENDENT_AMBULATORY_CARE_PROVIDER_SITE_OTHER): Payer: PPO | Admitting: Urology

## 2021-02-10 ENCOUNTER — Encounter: Payer: Self-pay | Admitting: Urology

## 2021-02-10 VITALS — BP 164/93 | HR 62

## 2021-02-10 DIAGNOSIS — N281 Cyst of kidney, acquired: Secondary | ICD-10-CM | POA: Diagnosis not present

## 2021-02-10 DIAGNOSIS — N1330 Unspecified hydronephrosis: Secondary | ICD-10-CM

## 2021-02-10 DIAGNOSIS — N401 Enlarged prostate with lower urinary tract symptoms: Secondary | ICD-10-CM

## 2021-02-10 DIAGNOSIS — R3912 Poor urinary stream: Secondary | ICD-10-CM | POA: Diagnosis not present

## 2021-02-10 MED ORDER — TAMSULOSIN HCL 0.4 MG PO CAPS: 0.4000 mg | ORAL_CAPSULE | Freq: Every day | ORAL | 11 refills | Status: DC

## 2021-02-10 NOTE — Progress Notes (Signed)
02/10/2021 1:19 PM   Darin Williams 04/12/1936 [id number]  Referring provider: Sofie Hartigan, [place],  [place] [address]  Chief Complaint  Patient presents with  . renal cyst    HPI: 85 year old male initially referred for further evaluation of an incidental renal cyst with concerns for previously unappreciated hydronephrosis on renal ultrasound returns today follow-up CT urogram.  This indicated no evidence of urinary obstruction.  Previously noted "hydronephrosis" likely represents parapelvic cyst.  No hydronephrosis on this follow-up study.  He mentions today that he does have some obstructive urinary symptoms.  He mentions sometimes it is difficult for him to start his stream, weak stream and sensation of incomplete bladder emptying.   He isnot currently on any BPH medications.  CT scan did note prostamegaly incidentally without any bladder abnormalities.   PMH: Past Medical History:  Diagnosis Date  . COPD (chronic obstructive pulmonary disease) ([place])   . Gout   . Hypertension   . Skin cancer     Surgical History: Past Surgical History:  Procedure Laterality Date  . EXTERNAL EAR SURGERY    . mohl's surgery      Home Medications:  Allergies as of 02/10/2021   No Known Allergies     Medication List       Accurate as of February 10, 2021  1:19 PM. If you have any questions, ask your nurse or doctor.        Afluria Quadrivalent injection Generic drug: influenza vac split quadrivalent   albuterol 108 (90 Base) MCG/ACT inhaler Commonly known as: VENTOLIN HFA Inhale into the lungs.   amLODipine 5 MG tablet Commonly known as: NORVASC Take by mouth.   cetirizine 10 MG tablet Commonly known as: ZYRTEC Take by mouth.   lisinopril 20 MG tablet Commonly known as: ZESTRIL Take by mouth.   simvastatin 20 MG tablet Commonly known as: ZOCOR Take by mouth.   Spiriva HandiHaler 18 MCG inhalation capsule Generic drug: tiotropium PLACE  ONE CAPSULE INTO INHALER AND INHALE ONCE DAILY   tamsulosin 0.4 MG Caps capsule Commonly known as: FLOMAX Take 1 capsule (0.4 mg total) by mouth daily. Started by: Hollice Espy, MD       Allergies: No Known Allergies  Family History: Family History  Problem Relation Age of Onset  . Dementia Mother   . Lung cancer Father   . Lung cancer Paternal Uncle   . Lung cancer Paternal Grandfather     Social History:  reports that he quit smoking about 17 years ago. He has never used smokeless tobacco. He reports that he does not drink alcohol and does not use drugs.   Physical Exam: BP (!) 164/93   Pulse 62   Constitutional:  Alert and oriented, No acute distress. HEENT: [place] AT, moist mucus membranes.  Trachea midline, no masses. Cardiovascular: No clubbing, cyanosis, or edema. Respiratory: Normal respiratory effort, no increased work of breathing. Skin: No rashes, bruises or suspicious lesions. Neurologic: Grossly intact, no focal deficits, moving all 4 extremities. Psychiatric: Normal mood and affect.  Laboratory Data: Lab Results  Component Value Date   WBC 5.7 11/13/2018   HGB 14.6 11/13/2018   HCT 44.3 11/13/2018   MCV 101.1 (H) 11/13/2018   PLT 260 11/13/2018   Urinalysis    Component Value Date/Time   APPEARANCEUR Clear 12/02/2020 0811   GLUCOSEU Negative 12/02/2020 0811   BILIRUBINUR Negative 12/02/2020 0811   PROTEINUR Negative 12/02/2020 0811   NITRITE Negative 12/02/2020 0811  LEUKOCYTESUR Negative 12/02/2020 0811    Lab Results  Component Value Date   LABMICR See below: 12/02/2020   WBCUA 0-5 12/02/2020   LABEPIT 0-10 12/02/2020   BACTERIA None seen 12/02/2020    Pertinent Imaging: CT HEMATURIA WORKUP  Narrative CLINICAL DATA:  Renal cyst  EXAM: CT ABDOMEN AND PELVIS WITHOUT AND WITH CONTRAST  TECHNIQUE: Multidetector CT imaging of the abdomen and pelvis was performed following the standard protocol before and following the  bolus administration of intravenous contrast.  CONTRAST:  120mL OMNIPAQUE IOHEXOL 300 MG/ML  SOLN  COMPARISON:  None.  FINDINGS: Lower chest: No acute abnormality.  Hepatobiliary: Scattered low-attenuation lesions of the liver, generally too small to characterize although statistically most likely subcentimeter cysts or hemangiomat. Small gallstones in the gallbladder. No gallbladder wall thickening, or biliary dilatation.  Pancreas: Unremarkable. No pancreatic ductal dilatation or surrounding inflammatory changes.  Spleen: Normal in size without significant abnormality.  Adrenals/Urinary Tract: Adrenal glands are unremarkable. The left kidney is asymmetrically atrophic. There are multiple bilateral renal cysts, including a dominant, large cyst of the superior pole of the right kidney measuring 11.9 cm. There are right-sided parapelvic cysts without hydronephrosis. Bladder is unremarkable.  Stomach/Bowel: Stomach is within normal limits. Appendix appears normal. No evidence of bowel wall thickening, distention, or inflammatory changes. Descending and sigmoid diverticulosis.  Vascular/Lymphatic: Aortic atherosclerosis. No enlarged abdominal or pelvic lymph nodes.  Reproductive: Prostatomegaly.  Other: No abdominal wall hernia or abnormality. No abdominopelvic ascites.  Musculoskeletal: No acute or significant osseous findings.  IMPRESSION: 1. There are multiple bilateral renal cysts, including a dominant, large cyst of the superior pole of the right kidney measuring 11.9 cm. These cysts are definitively benign but may be symptomatic due to large size. 2. There are right-sided parapelvic cysts without hydronephrosis as queried on prior ultrasound. 3. The left kidney is asymmetrically atrophic, perhaps related to prior obstructive, infectious, or ischemic insult. 4. Prostatomegaly. 5. Cholelithiasis.  Aortic Atherosclerosis (ICD10-I70.0).   Electronically  Signed By: Eddie Candle M.D. On: 02/04/2021 15:16  CT scan imaging was personally reviewed.  Agree with radiologic interpretation, there is no evidence of hydronephrosis.  Incidental prostamegaly.   Assessment & Plan:    1. Renal cyst Benign, no indication for further evaluation  Previous renal ultrasound indicated the possibility of right "hydronephrosis" but likely represents parapelvic cyst as there is no evidence of hydronephrosis on this study.  I discussed how the studies could be misinterpreted.  No further follow-up evaluation is needed at this time.  2. Hydronephrosis of right kidney Not real, as above  3. Benign prostatic hyperplasia with weak urinary stream Discussed options for urinary symptoms consistent with bladder outlet obstruction as he is now interested in managing the symptoms more aggressively  We discussed pharmacotherapy versus surgical or procedural intervention.  He does not want a pursue any sort of procedural surgical intervention at this time as he is never had surgery before like to avoid this.  He is interested in trying Flomax.  We discussed possible side effects including stuffiness, fatigue and dizziness.  We will stop them if he has any issues.  We will have him follow-up in 3 months for IPSS/PVR   Hollice Espy, MD  [facility] [address] [place],  [address] [phone]

## 2021-02-24 DIAGNOSIS — D225 Melanocytic nevi of trunk: Secondary | ICD-10-CM | POA: Diagnosis not present

## 2021-02-24 DIAGNOSIS — Z85828 Personal history of other malignant neoplasm of skin: Secondary | ICD-10-CM | POA: Diagnosis not present

## 2021-02-24 DIAGNOSIS — D2261 Melanocytic nevi of right upper limb, including shoulder: Secondary | ICD-10-CM | POA: Diagnosis not present

## 2021-02-24 DIAGNOSIS — D0461 Carcinoma in situ of skin of right upper limb, including shoulder: Secondary | ICD-10-CM | POA: Diagnosis not present

## 2021-02-24 DIAGNOSIS — L570 Actinic keratosis: Secondary | ICD-10-CM | POA: Diagnosis not present

## 2021-02-24 DIAGNOSIS — D485 Neoplasm of uncertain behavior of skin: Secondary | ICD-10-CM | POA: Diagnosis not present

## 2021-02-24 DIAGNOSIS — L821 Other seborrheic keratosis: Secondary | ICD-10-CM | POA: Diagnosis not present

## 2021-02-24 DIAGNOSIS — X32XXXA Exposure to sunlight, initial encounter: Secondary | ICD-10-CM | POA: Diagnosis not present

## 2021-02-24 DIAGNOSIS — D2262 Melanocytic nevi of left upper limb, including shoulder: Secondary | ICD-10-CM | POA: Diagnosis not present

## 2021-04-02 DIAGNOSIS — D0461 Carcinoma in situ of skin of right upper limb, including shoulder: Secondary | ICD-10-CM | POA: Diagnosis not present

## 2021-05-12 ENCOUNTER — Other Ambulatory Visit: Payer: Self-pay

## 2021-05-12 ENCOUNTER — Ambulatory Visit: Payer: PPO | Admitting: Urology

## 2021-05-12 ENCOUNTER — Encounter: Payer: Self-pay | Admitting: Urology

## 2021-05-12 VITALS — BP 164/75 | HR 66 | Ht 68.0 in | Wt 138.0 lb

## 2021-05-12 DIAGNOSIS — N5203 Combined arterial insufficiency and corporo-venous occlusive erectile dysfunction: Secondary | ICD-10-CM

## 2021-05-12 DIAGNOSIS — N401 Enlarged prostate with lower urinary tract symptoms: Secondary | ICD-10-CM

## 2021-05-12 DIAGNOSIS — R3912 Poor urinary stream: Secondary | ICD-10-CM

## 2021-05-12 LAB — BLADDER SCAN AMB NON-IMAGING: Scan Result: 38

## 2021-05-12 MED ORDER — TAMSULOSIN HCL 0.4 MG PO CAPS: 0.4000 mg | ORAL_CAPSULE | Freq: Every day | ORAL | 11 refills | Status: DC

## 2021-05-12 MED ORDER — SILDENAFIL CITRATE 20 MG PO TABS: ORAL_TABLET | ORAL | 11 refills | Status: AC

## 2021-05-12 NOTE — Progress Notes (Signed)
05/12/2021 12:41 PM   Darin Williams 04/27/1936 [id number]  Referring provider: Sofie Hartigan, MD [place],  [place] [address]  Chief Complaint  Patient presents with  . Benign Prostatic Hypertrophy    HPI: 85 year old male initially referred for further evaluation of possible hydronephrosis and a renal cyst ultimately found to have a parapelvic cyst rather than true hydronephrosis who also is now being managed for urinary symptoms related to BPH.  Since last visit, he started Flomax about 3 months ago.  He reports that he has seen a fairly dramatic improvement in his stream, he only gets up 1 or 2 times at night and feels like he empties his bladder well.  Is not experience any side effects.  He does void every 1 and half to 2 hours with feels like this is probably normal.  He also mentions today that he is actually had increased desire to "make love to his wife".  He reports that his erections are decreased in both rigidity and duration.  He has never tried sildenafil or Viagra.  He has no contraindications.  He is interested in trying this.    PMH: Past Medical History:  Diagnosis Date  . COPD (chronic obstructive pulmonary disease) ([place])   . Gout   . Hypertension   . Skin cancer     Surgical History: Past Surgical History:  Procedure Laterality Date  . EXTERNAL EAR SURGERY    . mohl's surgery      Home Medications:  Allergies as of 05/12/2021   No Known Allergies     Medication List       Accurate as of May 12, 2021 12:41 PM. If you have any questions, ask your nurse or doctor.        Afluria Quadrivalent injection Generic drug: influenza vac split quadrivalent   albuterol 108 (90 Base) MCG/ACT inhaler Commonly known as: VENTOLIN HFA Inhale into the lungs.   amLODipine 5 MG tablet Commonly known as: NORVASC Take by mouth.   cetirizine 10 MG tablet Commonly known as: ZYRTEC Take by mouth.   lisinopril 20 MG tablet Commonly known as:  ZESTRIL Take by mouth.   sildenafil 20 MG tablet Commonly known as: REVATIO Take 3-5 tabs 1 hour prior to intercourse Started by: Hollice Espy, MD   simvastatin 20 MG tablet Commonly known as: ZOCOR Take by mouth.   Spiriva HandiHaler 18 MCG inhalation capsule Generic drug: tiotropium PLACE ONE CAPSULE INTO INHALER AND INHALE ONCE DAILY   tamsulosin 0.4 MG Caps capsule Commonly known as: FLOMAX Take 1 capsule (0.4 mg total) by mouth daily.       Allergies: No Known Allergies  Family History: Family History  Problem Relation Age of Onset  . Dementia Mother   . Lung cancer Father   . Lung cancer Paternal Uncle   . Lung cancer Paternal Grandfather     Social History:  reports that he quit smoking about 17 years ago. He has never used smokeless tobacco. He reports that he does not drink alcohol and does not use drugs.   Physical Exam: BP (!) 164/75   Pulse 66   Ht 5\' 8"  (1.727 m)   Wt 138 lb (62.6 kg)   BMI 20.98 kg/m   Constitutional:  Alert and oriented, No acute distress. HEENT: [place] AT, moist mucus membranes.  Trachea midline, no masses. Cardiovascular: No clubbing, cyanosis, or edema. Respiratory: Normal respiratory effort, no increased work of breathing. Skin: No rashes, bruises or suspicious  lesions. Neurologic: Grossly intact, no focal deficits, moving all 4 extremities. Psychiatric: Normal mood and affect.  Results for orders placed or performed in visit on 05/12/21  BLADDER SCAN AMB NON-IMAGING  Result Value Ref Range   Scan Result 38      Assessment & Plan:    1. Benign prostatic hyperplasia with weak urinary stream Significant symptomatic relief with Flomax with minimal side effects  Will refill for the next year  - BLADDER SCAN AMB NON-IMAGING  2. 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  Possible side effects discussed.   F/u 1 year with IPSS/ PVR  Hollice Espy, MD  [facility] [address], [place], [place] [address] [phone]

## 2021-07-14 DIAGNOSIS — E7800 Pure hypercholesterolemia, unspecified: Secondary | ICD-10-CM | POA: Diagnosis not present

## 2021-07-14 DIAGNOSIS — Z0000 Encounter for general adult medical examination without abnormal findings: Secondary | ICD-10-CM | POA: Diagnosis not present

## 2021-07-14 DIAGNOSIS — I10 Essential (primary) hypertension: Secondary | ICD-10-CM | POA: Diagnosis not present

## 2021-07-14 DIAGNOSIS — R918 Other nonspecific abnormal finding of lung field: Secondary | ICD-10-CM | POA: Diagnosis not present

## 2021-07-14 DIAGNOSIS — R972 Elevated prostate specific antigen [PSA]: Secondary | ICD-10-CM | POA: Diagnosis not present

## 2021-07-14 DIAGNOSIS — M1A0720 Idiopathic chronic gout, left ankle and foot, without tophus (tophi): Secondary | ICD-10-CM | POA: Diagnosis not present

## 2021-07-14 DIAGNOSIS — N1831 Chronic kidney disease, stage 3a: Secondary | ICD-10-CM | POA: Diagnosis not present

## 2021-07-14 DIAGNOSIS — J432 Centrilobular emphysema: Secondary | ICD-10-CM | POA: Diagnosis not present

## 2021-08-05 ENCOUNTER — Telehealth: Payer: Self-pay

## 2021-08-05 ENCOUNTER — Telehealth: Payer: PPO

## 2021-08-05 DIAGNOSIS — R918 Other nonspecific abnormal finding of lung field: Secondary | ICD-10-CM

## 2021-08-05 NOTE — Telephone Encounter (Signed)
CT w/o contrast scheduled

## 2021-08-05 NOTE — Telephone Encounter (Signed)
Error

## 2021-08-05 NOTE — Telephone Encounter (Signed)
Left message for patient to return call to office-wanted to let him know that I have placed a referral to [facility] in [facility] for continuance of care for lung nodule due to Dr.Oaks retiring.Someone from their office should contact you within 7-10 days to schedule an appointment-if you do not hear from someone within the time frame listed above please call and let me know.

## 2021-08-05 NOTE — Telephone Encounter (Signed)
CT Chest scheduled 09/15/21 @ 12:45-Outpatient Imaging-nothing to eat/drink 4 hours prior. No prep.   Left message letting [facility] @ [facility] know the appointment information listed above.

## 2021-08-27 DIAGNOSIS — X32XXXA Exposure to sunlight, initial encounter: Secondary | ICD-10-CM | POA: Diagnosis not present

## 2021-08-27 DIAGNOSIS — D485 Neoplasm of uncertain behavior of skin: Secondary | ICD-10-CM | POA: Diagnosis not present

## 2021-08-27 DIAGNOSIS — D2262 Melanocytic nevi of left upper limb, including shoulder: Secondary | ICD-10-CM | POA: Diagnosis not present

## 2021-08-27 DIAGNOSIS — D2261 Melanocytic nevi of right upper limb, including shoulder: Secondary | ICD-10-CM | POA: Diagnosis not present

## 2021-08-27 DIAGNOSIS — D2271 Melanocytic nevi of right lower limb, including hip: Secondary | ICD-10-CM | POA: Diagnosis not present

## 2021-08-27 DIAGNOSIS — Z85828 Personal history of other malignant neoplasm of skin: Secondary | ICD-10-CM | POA: Diagnosis not present

## 2021-08-27 DIAGNOSIS — D0439 Carcinoma in situ of skin of other parts of face: Secondary | ICD-10-CM | POA: Diagnosis not present

## 2021-08-27 DIAGNOSIS — L570 Actinic keratosis: Secondary | ICD-10-CM | POA: Diagnosis not present

## 2021-09-11 ENCOUNTER — Encounter: Payer: PPO | Admitting: Thoracic Surgery (Cardiothoracic Vascular Surgery)

## 2021-09-15 ENCOUNTER — Other Ambulatory Visit: Payer: Self-pay

## 2021-09-15 ENCOUNTER — Ambulatory Visit
Admission: RE | Admit: 2021-09-15 | Discharge: 2021-09-15 | Disposition: A | Discharge status: Home / Self Care | Payer: PPO | Source: Ambulatory Visit | Attending: Surgery | Admitting: Surgery

## 2021-09-15 DIAGNOSIS — R918 Other nonspecific abnormal finding of lung field: Secondary | ICD-10-CM | POA: Diagnosis not present

## 2021-09-15 DIAGNOSIS — J449 Chronic obstructive pulmonary disease, unspecified: Secondary | ICD-10-CM | POA: Diagnosis not present

## 2021-09-15 DIAGNOSIS — J439 Emphysema, unspecified: Secondary | ICD-10-CM | POA: Diagnosis not present

## 2021-09-15 DIAGNOSIS — R911 Solitary pulmonary nodule: Secondary | ICD-10-CM | POA: Diagnosis not present

## 2021-09-15 DIAGNOSIS — J9811 Atelectasis: Secondary | ICD-10-CM | POA: Diagnosis not present

## 2021-09-18 DIAGNOSIS — H2513 Age-related nuclear cataract, bilateral: Secondary | ICD-10-CM | POA: Diagnosis not present

## 2021-09-22 ENCOUNTER — Other Ambulatory Visit: Payer: Self-pay

## 2021-09-22 ENCOUNTER — Institutional Professional Consult (permissible substitution): Payer: PPO | Admitting: Thoracic Surgery (Cardiothoracic Vascular Surgery)

## 2021-09-22 ENCOUNTER — Encounter: Payer: Self-pay | Admitting: Thoracic Surgery (Cardiothoracic Vascular Surgery)

## 2021-09-22 VITALS — BP 157/78 | HR 60 | Resp 20 | Ht 68.0 in | Wt 142.0 lb

## 2021-09-22 DIAGNOSIS — R918 Other nonspecific abnormal finding of lung field: Secondary | ICD-10-CM

## 2021-09-22 NOTE — Progress Notes (Signed)
PCP is Feldpausch, Chrissie Noa, MD Referring Provider is Jules Husbands, MD  Chief Complaint  Patient presents with   Lung Lesion    Surgical consult, Chest CT 09/15/2021    HPI: Darin Williams is sent for consultation regarding abnormalities on CT of the chest.  Darin Williams is an 85 year old former smoker (quit 2003) with a history of COPD, gout, hypertension, skin cancer, and an abnormal chest CT.  Back in 2019 he presented with worsening shortness of breath while singing in church.  He had a chest x-ray which showed an opacity at the right apex.  A CT showed masslike density in the right upper lobe.  There was concern for possible cancer.  He had a CT-guided needle biopsy.  That did not show cancer.  He then was followed radiographically.  That area improved there is some persistent scarring there.  He has had other small lung nodules that have been stable over time.  He has been feeling well.  Has not had any recent problems with his breathing.  He continues to sing in church.  No change in appetite or weight loss.  No fevers, chills, or sweats.   Past Medical History:  Diagnosis Date   COPD (chronic obstructive pulmonary disease) ([place])    Gout    Hypertension    Skin cancer     Past Surgical History:  Procedure Laterality Date   EXTERNAL EAR SURGERY     mohl's surgery      Family History  Problem Relation Age of Onset   Dementia Mother    Lung cancer Father    Lung cancer Paternal Uncle    Lung cancer Paternal Grandfather     Social History Social History   Tobacco Use   Smoking status: Former    Types: Cigarettes    Quit date: 2005    Years since quitting: 17.7   Smokeless tobacco: Never  Substance Use Topics   Alcohol use: Never   Drug use: Never    Current Outpatient Medications  Medication Sig Dispense Refill   AFLURIA QUADRIVALENT injection      albuterol (PROVENTIL HFA;VENTOLIN HFA) 108 (90 Base) MCG/ACT inhaler Inhale into the lungs.     amLODipine (NORVASC)  5 MG tablet Take by mouth.     cetirizine (ZYRTEC) 10 MG tablet Take by mouth.     lisinopril (PRINIVIL,ZESTRIL) 20 MG tablet Take by mouth.     sildenafil (REVATIO) 20 MG tablet Take 3-5 tabs 1 hour prior to intercourse 60 tablet 11   simvastatin (ZOCOR) 20 MG tablet Take by mouth.     SPIRIVA HANDIHALER 18 MCG inhalation capsule PLACE ONE CAPSULE INTO INHALER AND INHALE ONCE DAILY  3   tamsulosin (FLOMAX) 0.4 MG CAPS capsule Take 1 capsule (0.4 mg total) by mouth daily. 30 capsule 11   No current facility-administered medications for this visit.    No Known Allergies  Review of Systems  Constitutional:  Negative for activity change and appetite change.  HENT:  Positive for dental problem and hearing loss.   Respiratory:  Positive for cough and shortness of breath (With exertion, stable).   Cardiovascular:  Negative for chest pain and leg swelling.  Genitourinary:  Positive for difficulty urinating and frequency.  Hematological:  Bruises/bleeds easily.  All other systems reviewed and are negative.  BP (!) 157/78   Pulse 60   Resp 20   Ht 5\' 8"  (1.727 m)   Wt 142 lb (64.4 kg)   SpO2 95%  Comment: RA  BMI 21.59 kg/m  Physical Exam Vitals reviewed.  Constitutional:      General: He is not in acute distress.    Appearance: Normal appearance.  HENT:     Head: Normocephalic and atraumatic.  Eyes:     General: No scleral icterus.    Extraocular Movements: Extraocular movements intact.  Cardiovascular:     Rate and Rhythm: Normal rate and regular rhythm.     Heart sounds: Normal heart sounds. No murmur heard. Pulmonary:     Effort: No respiratory distress.     Breath sounds: Normal breath sounds. No wheezing or rales.  Abdominal:     General: There is no distension.     Palpations: Abdomen is soft.  Musculoskeletal:     Cervical back: Neck supple.  Lymphadenopathy:     Cervical: No cervical adenopathy.  Skin:    General: Skin is warm and dry.  Neurological:      General: No focal deficit present.     Mental Status: He is alert and oriented to person, place, and time.     Cranial Nerves: No cranial nerve deficit.     Diagnostic Tests: CT CHEST WITHOUT CONTRAST   TECHNIQUE: Multidetector CT imaging of the chest was performed following the standard protocol without IV contrast. Sagittal and coronal MPR images reconstructed from axial data set.   COMPARISON:  09/11/2020, 03/16/2019   FINDINGS: Cardiovascular: Atherosclerotic calcifications aorta, coronary arteries, and at LEFT carotid bifurcation. And normal caliber. Heart unremarkable. No pericardial effusion.   Mediastinum/Nodes: Esophagus unremarkable. Base of cervical region normal appearance. 13 mm short axis RIGHT axillary lymph node image 23 previously 12 mm. No additional thoracic adenopathy.   Lungs/Pleura: Severe centrilobular emphysematous changes with chronic scarring with calcification in posterior RIGHT upper lobe. Chronic peribronchial thickening. 2 mm LEFT upper lobe pulmonary nodule image 71 unchanged since 2020. 3 mm subpleural nodule RIGHT middle lobe image 96 unchanged since 2020. Chronic subsegmental atelectasis in lingula unchanged. No pulmonary infiltrate, pleural effusion, or pneumothorax.   Upper Abdomen: Small hepatic cysts largest 19 mm inferior RIGHT lower lobe unchanged. BILATERAL renal cysts, including a 12.2 x 11.0 cm upper pole RIGHT renal cyst and a 3.9 x 3.7 cm upper pole LEFT renal cyst little changed. Small splenule.   Musculoskeletal: No acute osseous findings.   IMPRESSION: Emphysematous and bronchitic changes consistent with COPD.   Stable tiny pulmonary nodules unchanged since 2020; 2 years of stability is indicative of benign behavior and no further follow-up is recommended.   Hepatic and large renal cysts.   Single nonspecific 13 mm short axis RIGHT axillary lymph node, not significantly changed.   No new intrathoracic abnormalities.    Significant atherosclerotic calcifications at the LEFT carotid bifurcation; consider duplex carotid sonographic assessment.   Aortic Atherosclerosis (ICD10-I70.0) and Emphysema (ICD10-J43.9).     Electronically Signed   By: Lavonia Dana M.D.   On: 09/16/2021 09:18 I personally reviewed the CT images and compared them to films dating back to October 2019.  There is extensive emphysema.  There is stable scarring in the right upper lobe.  Small calcified nodules in the left upper lobe and right middle lobe.  No findings suspicious for lung cancer.  Impression: Darin Williams is an 85 year old former smoker (quit 2003) with a history of COPD, gout, hypertension, skin cancer, and an abnormal chest CT.   Scarring right upper lobe-stable.  Dramatically decreased in size from acute infection in October 2019.  We will repeat a scan  in 1 year.  Lung nodules-small calcified nodules.  No suspicious nodules.  Thoracic aortic atherosclerosis-on Zocor  Tobacco abuse-quit smoking about 20 years ago  Plan: Return in 1 year with CT chest  Melrose Nakayama, MD [facility] [phone]

## 2022-01-15 DIAGNOSIS — R972 Elevated prostate specific antigen [PSA]: Secondary | ICD-10-CM | POA: Diagnosis not present

## 2022-01-15 DIAGNOSIS — E7800 Pure hypercholesterolemia, unspecified: Secondary | ICD-10-CM | POA: Diagnosis not present

## 2022-01-15 DIAGNOSIS — N1831 Chronic kidney disease, stage 3a: Secondary | ICD-10-CM | POA: Diagnosis not present

## 2022-01-15 DIAGNOSIS — I10 Essential (primary) hypertension: Secondary | ICD-10-CM | POA: Diagnosis not present

## 2022-01-15 DIAGNOSIS — M1A0720 Idiopathic chronic gout, left ankle and foot, without tophus (tophi): Secondary | ICD-10-CM | POA: Diagnosis not present

## 2022-01-15 DIAGNOSIS — J432 Centrilobular emphysema: Secondary | ICD-10-CM | POA: Diagnosis not present

## 2022-01-15 DIAGNOSIS — R918 Other nonspecific abnormal finding of lung field: Secondary | ICD-10-CM | POA: Diagnosis not present

## 2022-05-05 DIAGNOSIS — L570 Actinic keratosis: Secondary | ICD-10-CM | POA: Diagnosis not present

## 2022-05-11 NOTE — Progress Notes (Signed)
? ? ?05/12/2022 ?11:41 AM  ? ?Darin Williams ?Feb 04, 1936 ?[id number] ? ?Referring provider: Sofie Hartigan, MD ?[address] ?[place],  [place] [address] ? ?Urological history: ?1. BPH with LU TS ?-PSA 3.46, 06/2020 ?-I PSS 8/2 ?-PVR 38 mL ?-tamsulosin 0.4 mg daily  ? ?2. ED ?-contributing factors of age, former smoker, hypertension, hyperlipidemia, COPD, BPH and Peyronie's disease ?-SHIM 5 ?-sildenafil 20 mg, on-demand-dosing  ? ?3. Renal cysts ?-CTU, 01/2021 - There are multiple bilateral renal cysts, including a dominant, large cyst of the superior pole of the right kidney measuring 11.9 cm. These cysts are definitively benign but may be symptomatic due to large size.  There are right-sided parapelvic cysts without hydronephrosis as queried on prior ultrasound. ? ?4. Renal atrophy ?-CTU, 01/2021 - The left kidney is asymmetrically atrophic, perhaps related to prior obstructive, infectious, or ischemic insult ? ? ?Chief Complaint  ?Patient presents with  ? Benign Prostatic Hypertrophy  ? Follow-up  ? ? ?HPI: ?Darin Williams is a 86 y.o. male who presents today for yearly follow up.  ? ?He is not having urinary issues.  Patient denies any modifying or aggravating factors.  Patient denies any gross hematuria, dysuria or suprapubic/flank pain.  Patient denies any fevers, chills, nausea or vomiting.   ? ? IPSS   ? ? [place] Name 05/12/22 1100  ?  ?  ?  ? International Prostate Symptom Score  ? How often have you had the sensation of not emptying your bladder? Less than 1 in 5    ? How often have you had to urinate less than every two hours? Less than half the time    ? How often have you found you stopped and started again several times when you urinated? Less than 1 in 5 times    ? How often have you found it difficult to postpone urination? Less than half the time    ? How often have you had a weak urinary stream? Less than 1 in 5 times    ? How often have you had to strain to start urination? Not at All    ? How many times  did you typically get up at night to urinate? 1 Time    ? Total IPSS Score 8    ?  ? Quality of Life due to urinary symptoms  ? If you were to spend the rest of your life with your urinary condition just the way it is now how would you feel about that? Mostly Satisfied    ? ?  ?  ? ?  ? ? ?Score:  ?1-7 Mild ?8-19 Moderate ?20-35 Severe  ? ?Patient is not having spontaneous erections.  He denies any pain or curvature with erections.  His wife is having issues with vaginal atrophy and is hesitant to seek treatment for this.  He is able to achieve an erection with the sildenafil, but when he goes to penetrate he loses the erection.  It is difficult to know whether this is due to his wife's vaginal atrophy that may be causing vaginal stenosis or distal part of his ED. ? ? SHIM   ? ? [place] Name 05/12/22 1107  ?  ?  ?  ? SHIM: Over the last 6 months:  ? How do you rate your confidence that you could get and keep an erection? Very Low    ? When you had erections with sexual stimulation, how often were your erections hard enough for penetration (entering your  partner)? Almost Never or Never    ? During sexual intercourse, how often were you able to maintain your erection after you had penetrated (entered) your partner? Almost Never or Never    ? During sexual intercourse, how difficult was it to maintain your erection to completion of intercourse? Extremely Difficult    ? When you attempted sexual intercourse, how often was it satisfactory for you? Almost Never or Never    ?  ? SHIM Total Score  ? SHIM 5    ? ?  ?  ? ?  ? ? ?Score: ?1-7 Severe ED ?8-11 Moderate ED ?12-16 Mild-Moderate ED ?17-21 Mild ED ?22-25 No ED    ? ?PMH: ?Past Medical History:  ?Diagnosis Date  ? COPD (chronic obstructive pulmonary disease) ([place])   ? Gout   ? Hypertension   ? Skin cancer   ? ? ?Surgical History: ?Past Surgical History:  ?Procedure Laterality Date  ? EXTERNAL EAR SURGERY    ? mohl's surgery    ? ? ?Home Medications:  ?Allergies as of  05/12/2022   ?No Known Allergies ?  ? ?  ?Medication List  ?  ? ?  ? Accurate as of May 12, 2022 11:41 AM. If you have any questions, ask your nurse or doctor.  ?  ?  ? ?  ? ?Afluria Quadrivalent injection ?Generic drug: influenza vac split quadrivalent ?  ?albuterol 108 (90 Base) MCG/ACT inhaler ?Commonly known as: VENTOLIN HFA ?Inhale into the lungs. ?  ?amLODipine 5 MG tablet ?Commonly known as: NORVASC ?Take by mouth. ?  ?cetirizine 10 MG tablet ?Commonly known as: ZYRTEC ?Take by mouth. ?  ?lisinopril 20 MG tablet ?Commonly known as: ZESTRIL ?Take by mouth. ?  ?sildenafil 20 MG tablet ?Commonly known as: REVATIO ?Take 3-5 tabs 1 hour prior to intercourse ?  ?simvastatin 20 MG tablet ?Commonly known as: ZOCOR ?Take by mouth. ?  ?Spiriva HandiHaler 18 MCG inhalation capsule ?Generic drug: tiotropium ?PLACE ONE CAPSULE INTO INHALER AND INHALE ONCE DAILY ?  ?tamsulosin 0.4 MG Caps capsule ?Commonly known as: FLOMAX ?Take 1 capsule (0.4 mg total) by mouth daily. ?  ? ?  ? ? ?Allergies: No Known Allergies ? ?Family History: ?Family History  ?Problem Relation Age of Onset  ? Dementia Mother   ? Lung cancer Father   ? Lung cancer Paternal Uncle   ? Lung cancer Paternal Grandfather   ? ? ?Social History:  reports that he quit smoking about 18 years ago. He has never used smokeless tobacco. He reports that he does not drink alcohol and does not use drugs. ? ?ROS: ?Pertinent ROS in HPI ? ?Physical Exam: ?BP (!) 153/77   Pulse (!) 59   Ht 5' 6"  (1.676 m)   Wt 139 lb (63 kg)   BMI 22.44 kg/m?   ?Constitutional:  Well nourished. Alert and oriented, No acute distress. ?HEENT: [place] AT, moist mucus membranes.  Trachea midline, no masses. ?Cardiovascular: No clubbing, cyanosis, or edema. ?Respiratory: Normal respiratory effort, no increased work of breathing. ?Neurologic: Grossly intact, no focal deficits, moving all 4 extremities. ?Psychiatric: Normal mood and affect. ? ?Laboratory Data: ?Glucose 70 - 110 mg/dL 92   ?Sodium  136 - 145 mmol/L 140   ?Potassium 3.6 - 5.1 mmol/L 4.2   ?Chloride 97 - 109 mmol/L 106   ?Carbon Dioxide (CO2) 22.0 - 32.0 mmol/L 25.4   ?Urea Nitrogen (BUN) 7 - 25 mg/dL 22   ?Creatinine 0.7 - 1.3 mg/dL 1.2   ?  Glomerular Filtration Rate (eGFR), MDRD Estimate >60 mL/min/1.73sq m 58 Low    ?Calcium 8.7 - 10.3 mg/dL 9.6   ?AST  8 - 39 U/L 33   ?ALT  6 - 57 U/L 29   ?Alk Phos (alkaline Phosphatase) 34 - 104 U/L 105 High    ?Albumin 3.5 - 4.8 g/dL 4.6   ?Bilirubin, Total 0.3 - 1.2 mg/dL 0.7   ?Protein, Total 6.1 - 7.9 g/dL 7.2   ?A/G Ratio 1.0 - 5.0 gm/dL 1.8   ?Resulting Agency  [facility]  ?Specimen Collected: 01/15/22 11:19 Last Resulted: 01/15/22 15:45  ?Received From: [facility]  Result Received: 05/04/22 08:08  ? ?Cholesterol, Total 100 - 200 mg/dL 197   ?Triglyceride 35 - 199 mg/dL 134   ?HDL (High Density Lipoprotein) Cholesterol 29.0 - 71.0 mg/dL 43.1   ?LDL Calculated 0 - 130 mg/dL 127   ?VLDL Cholesterol mg/dL 27   ?Cholesterol/HDL Ratio  4.6   ?Resulting Agency  [facility]  ?Specimen Collected: 01/15/22 11:19 Last Resulted: 01/15/22 15:45  ?Received From: [facility]  Result Received: 05/04/22 08:08  ?I have reviewed the labs. ? ? ?Pertinent Imaging: ? 05/12/22 11:00  ?Scan Result 44m  ? ?Assessment & Plan:   ? ?1. BPH with LUTS ?-PVR < 300 cc ?-continue conservative management, avoiding bladder irritants and timed voiding's ?-Tamsulosin 0.4 mg daily ? ?2. ED ?-Explained to him that he may mention to his wife that vaginal atrophy and vaginal stenosis is not just an issue that can interfere with sexual intercourse, but can interfere with urination and cause recurrent urinary tract infections, incontinence and/or urinary retention and ask her doctor to perform a pelvic exam to evaluate for this ? ? ?Return in about 1 year (around 05/13/2023) for IPSS, SHIM, PVR and exam. ? ?These notes generated with voice recognition software. I apologize  for typographical errors. ? ?Waseem Suess, PA-C ? ?[place] [place] [address]?[phone]?  ?

## 2022-05-12 ENCOUNTER — Ambulatory Visit: Payer: PPO | Admitting: Urology

## 2022-05-12 ENCOUNTER — Encounter: Payer: Self-pay | Admitting: Urology

## 2022-05-12 VITALS — BP 153/77 | HR 59 | Ht 66.0 in | Wt 139.0 lb

## 2022-05-12 DIAGNOSIS — N261 Atrophy of kidney (terminal): Secondary | ICD-10-CM

## 2022-05-12 DIAGNOSIS — R3912 Poor urinary stream: Secondary | ICD-10-CM | POA: Diagnosis not present

## 2022-05-12 DIAGNOSIS — N529 Male erectile dysfunction, unspecified: Secondary | ICD-10-CM | POA: Diagnosis not present

## 2022-05-12 DIAGNOSIS — N281 Cyst of kidney, acquired: Secondary | ICD-10-CM

## 2022-05-12 DIAGNOSIS — N401 Enlarged prostate with lower urinary tract symptoms: Secondary | ICD-10-CM | POA: Diagnosis not present

## 2022-05-12 LAB — BLADDER SCAN AMB NON-IMAGING

## 2022-05-12 MED ORDER — TAMSULOSIN HCL 0.4 MG PO CAPS: 0.4000 mg | ORAL_CAPSULE | Freq: Every day | ORAL | 3 refills | Status: DC

## 2022-06-26 DIAGNOSIS — U071 COVID-19: Secondary | ICD-10-CM | POA: Diagnosis not present

## 2022-06-26 DIAGNOSIS — J441 Chronic obstructive pulmonary disease with (acute) exacerbation: Secondary | ICD-10-CM | POA: Diagnosis not present

## 2022-06-26 DIAGNOSIS — Z03818 Encounter for observation for suspected exposure to other biological agents ruled out: Secondary | ICD-10-CM | POA: Diagnosis not present

## 2022-07-30 ENCOUNTER — Other Ambulatory Visit: Payer: Self-pay | Admitting: *Deleted

## 2022-07-30 DIAGNOSIS — R918 Other nonspecific abnormal finding of lung field: Secondary | ICD-10-CM

## 2022-08-05 DIAGNOSIS — J431 Panlobular emphysema: Secondary | ICD-10-CM | POA: Diagnosis not present

## 2022-09-21 ENCOUNTER — Other Ambulatory Visit: Payer: PPO

## 2022-09-21 ENCOUNTER — Encounter: Payer: PPO | Admitting: Thoracic Surgery (Cardiothoracic Vascular Surgery)

## 2022-10-25 DIAGNOSIS — Z0000 Encounter for general adult medical examination without abnormal findings: Secondary | ICD-10-CM | POA: Diagnosis not present

## 2022-10-25 DIAGNOSIS — R918 Other nonspecific abnormal finding of lung field: Secondary | ICD-10-CM | POA: Diagnosis not present

## 2022-10-25 DIAGNOSIS — J432 Centrilobular emphysema: Secondary | ICD-10-CM | POA: Diagnosis not present

## 2022-10-25 DIAGNOSIS — R972 Elevated prostate specific antigen [PSA]: Secondary | ICD-10-CM | POA: Diagnosis not present

## 2022-10-25 DIAGNOSIS — I10 Essential (primary) hypertension: Secondary | ICD-10-CM | POA: Diagnosis not present

## 2022-10-25 DIAGNOSIS — N1831 Chronic kidney disease, stage 3a: Secondary | ICD-10-CM | POA: Diagnosis not present

## 2022-10-25 DIAGNOSIS — E7800 Pure hypercholesterolemia, unspecified: Secondary | ICD-10-CM | POA: Diagnosis not present

## 2022-10-25 DIAGNOSIS — M1A0720 Idiopathic chronic gout, left ankle and foot, without tophus (tophi): Secondary | ICD-10-CM | POA: Diagnosis not present

## 2022-11-16 DIAGNOSIS — L570 Actinic keratosis: Secondary | ICD-10-CM | POA: Diagnosis not present

## 2022-11-16 DIAGNOSIS — Z872 Personal history of diseases of the skin and subcutaneous tissue: Secondary | ICD-10-CM | POA: Diagnosis not present

## 2022-11-16 DIAGNOSIS — D2272 Melanocytic nevi of left lower limb, including hip: Secondary | ICD-10-CM | POA: Diagnosis not present

## 2022-11-16 DIAGNOSIS — X32XXXA Exposure to sunlight, initial encounter: Secondary | ICD-10-CM | POA: Diagnosis not present

## 2022-11-16 DIAGNOSIS — D2262 Melanocytic nevi of left upper limb, including shoulder: Secondary | ICD-10-CM | POA: Diagnosis not present

## 2022-11-16 DIAGNOSIS — Z85828 Personal history of other malignant neoplasm of skin: Secondary | ICD-10-CM | POA: Diagnosis not present

## 2022-11-16 DIAGNOSIS — D2261 Melanocytic nevi of right upper limb, including shoulder: Secondary | ICD-10-CM | POA: Diagnosis not present

## 2022-12-07 DIAGNOSIS — R911 Solitary pulmonary nodule: Secondary | ICD-10-CM | POA: Diagnosis not present

## 2022-12-07 DIAGNOSIS — J431 Panlobular emphysema: Secondary | ICD-10-CM | POA: Diagnosis not present

## 2022-12-07 DIAGNOSIS — J42 Unspecified chronic bronchitis: Secondary | ICD-10-CM | POA: Diagnosis not present

## 2023-05-11 DIAGNOSIS — R918 Other nonspecific abnormal finding of lung field: Secondary | ICD-10-CM | POA: Diagnosis not present

## 2023-05-11 DIAGNOSIS — N1831 Chronic kidney disease, stage 3a: Secondary | ICD-10-CM | POA: Diagnosis not present

## 2023-05-11 DIAGNOSIS — I10 Essential (primary) hypertension: Secondary | ICD-10-CM | POA: Diagnosis not present

## 2023-05-11 DIAGNOSIS — M1A0720 Idiopathic chronic gout, left ankle and foot, without tophus (tophi): Secondary | ICD-10-CM | POA: Diagnosis not present

## 2023-05-11 DIAGNOSIS — E7800 Pure hypercholesterolemia, unspecified: Secondary | ICD-10-CM | POA: Diagnosis not present

## 2023-05-11 DIAGNOSIS — R972 Elevated prostate specific antigen [PSA]: Secondary | ICD-10-CM | POA: Diagnosis not present

## 2023-05-11 DIAGNOSIS — J432 Centrilobular emphysema: Secondary | ICD-10-CM | POA: Diagnosis not present

## 2023-05-12 NOTE — Progress Notes (Signed)
05/13/2023 10:26 AM   Darin Williams 02-Dec-1936 [id number]  Referring provider: Marina Goodell, MD [address] [place],  [place] [address]  Urological history: 1. BPH with LU TS -PSA 3.46, 06/2020 -tamsulosin 0.4 mg daily   2. ED -contributing factors of age, former smoker, hypertension, hyperlipidemia, COPD, BPH and Peyronie's disease -sildenafil 20 mg, on-demand-dosing   3. Renal cysts -CTU, 01/2021 - There are multiple bilateral renal cysts, including a dominant, large cyst of the superior pole of the right kidney measuring 11.9 cm. These cysts are definitively benign but may be symptomatic due to large size.  There are right-sided parapelvic cysts without hydronephrosis as queried on prior ultrasound.  4. Renal atrophy -CTU, 01/2021 - The left kidney is asymmetrically atrophic, perhaps related to prior obstructive, infectious, or ischemic insult   Chief Complaint  Patient presents with   Follow-up   Benign Prostatic Hypertrophy    HPI: Darin Williams is a 87 y.o. male who presents today for yearly follow up.   I PSS 4/0  He has no urinary complaints at this time.  Patient denies any modifying or aggravating factors.  Patient denies any gross hematuria, dysuria or suprapubic/flank pain.  Patient denies any fevers, chills, nausea or vomiting.   He is taking tamsulosin 0.4 mg daily.   IPSS     Row Name 05/13/23 0900         International Prostate Symptom Score   How often have you had the sensation of not emptying your bladder? Less than 1 in 5     How often have you had to urinate less than every two hours? Less than 1 in 5 times     How often have you found you stopped and started again several times when you urinated? Not at All     How often have you found it difficult to postpone urination? Not at All     How often have you had a weak urinary stream? Less than 1 in 5 times     How often have you had to strain to start urination? Not at All     How many  times did you typically get up at night to urinate? 1 Time     Total IPSS Score 4       Quality of Life due to urinary symptoms   If you were to spend the rest of your life with your urinary condition just the way it is now how would you feel about that? Pleased               Score:  1-7 Mild 8-19 Moderate 20-35 Severe   SHIM 11  Patient is not having spontaneous erections.    He denies any pain or curvature with erections.   He does have a prescription for sildenafil, but he is not sexually active with his wife at this time.  His wife is actually a full-time employee working 12+ hours Monday through Friday and carried your computer home on the weekend and is attached to a work cell phone.  He feels it is unfair at this time to pursue a sexual relationship with her as she is exhausted.  He does state that he and his wife have a good relationship.   SHIM     Row Name 05/13/23 0947         SHIM: Over the last 6 months:   How do you rate your confidence that you could get and keep an  erection? Very Low     When you had erections with sexual stimulation, how often were your erections hard enough for penetration (entering your partner)? A Few Times (much less than half the time)     During sexual intercourse, how often were you able to maintain your erection after you had penetrated (entered) your partner? A Few Times (much less than half the time)     During sexual intercourse, how difficult was it to maintain your erection to completion of intercourse? Difficult     When you attempted sexual intercourse, how often was it satisfactory for you? Sometimes (about half the time)       SHIM Total Score   SHIM 11               Score: 1-7 Severe ED 8-11 Moderate ED 12-16 Mild-Moderate ED 17-21 Mild ED 22-25 No ED     PMH: Past Medical History:  Diagnosis Date   COPD (chronic obstructive pulmonary disease) ([facility])    Gout    Hypertension    Skin cancer     Surgical  History: Past Surgical History:  Procedure Laterality Date   EXTERNAL EAR SURGERY     mohl's surgery      Home Medications:  Allergies as of 05/13/2023   No Known Allergies      Medication List        Accurate as of May 13, 2023 10:26 AM. If you have any questions, ask your nurse or doctor.          Afluria Quadrivalent injection Generic drug: influenza vac split quadrivalent   albuterol 108 (90 Base) MCG/ACT inhaler Commonly known as: VENTOLIN HFA Inhale into the lungs.   amLODipine 5 MG tablet Commonly known as: NORVASC Take by mouth.   cetirizine 10 MG tablet Commonly known as: ZYRTEC Take by mouth.   lisinopril 20 MG tablet Commonly known as: ZESTRIL Take by mouth.   sildenafil 20 MG tablet Commonly known as: REVATIO Take 3-5 tabs 1 hour prior to intercourse   simvastatin 20 MG tablet Commonly known as: ZOCOR Take by mouth.   Spiriva HandiHaler 18 MCG inhalation capsule Generic drug: tiotropium PLACE ONE CAPSULE INTO INHALER AND INHALE ONCE DAILY   tamsulosin 0.4 MG Caps capsule Commonly known as: FLOMAX Take 1 capsule (0.4 mg total) by mouth daily.        Allergies: No Known Allergies  Family History: Family History  Problem Relation Age of Onset   Dementia Mother    Lung cancer Father    Lung cancer Paternal Uncle    Lung cancer Paternal Grandfather     Social History:  reports that he quit smoking about 19 years ago. His smoking use included cigarettes. He has never used smokeless tobacco. He reports that he does not drink alcohol and does not use drugs.  ROS: Pertinent ROS in HPI  Physical Exam: BP (!) 159/97   Pulse (!) 57   Ht 5\' 6"  (1.676 m)   Wt 139 lb (63 kg)   BMI 22.44 kg/m   Constitutional:  Well nourished. Alert and oriented, No acute distress. HEENT: [place] AT, moist mucus membranes.  Trachea midline Cardiovascular: No clubbing, cyanosis, or edema. Respiratory: Normal respiratory effort, no increased work of  breathing. Neurologic: Grossly intact, no focal deficits, moving all 4 extremities. Psychiatric: Normal mood and affect.   Laboratory Data: Serum creatinine (04/2023) 1.3, eGFR 54 I have reviewed the labs.   Pertinent Imaging: N/A  Assessment & Plan:  1. BPH with LUTS -continue conservative management, avoiding bladder irritants and timed voiding's -Continue tamsulosin 0.4 mg daily  2. ED -He does have a prescription for sildenafil 20 mg on-demand dosing -He is currently not sexually active with his wife due to her intense work schedule, but he has a happy relationship with his wife  Return in about 1 year (around 05/12/2024) for IPSS, SHIM w/ Dr. Apolinar Junes (last seen by MD 2022) .  These notes generated with voice recognition software. I apologize for typographical errors.  Cloretta Ned  [facility] [address]  [address] [place], [place] [address] [phone]

## 2023-05-13 ENCOUNTER — Ambulatory Visit: Payer: PPO | Admitting: Urology

## 2023-05-13 VITALS — BP 158/87 | HR 57 | Ht 66.0 in | Wt 139.0 lb

## 2023-05-13 DIAGNOSIS — N529 Male erectile dysfunction, unspecified: Secondary | ICD-10-CM | POA: Diagnosis not present

## 2023-05-13 DIAGNOSIS — R3912 Poor urinary stream: Secondary | ICD-10-CM

## 2023-05-13 DIAGNOSIS — N401 Enlarged prostate with lower urinary tract symptoms: Secondary | ICD-10-CM | POA: Diagnosis not present

## 2023-05-13 NOTE — Progress Notes (Signed)
Darin Williams presents for an office/procedure visit. BP today is 158/87. He is complaint with BP medication. Greater than 140/90. Provider  notified. Pt advised to follow up with  PCP. Pt voiced understanding.

## 2023-05-20 ENCOUNTER — Other Ambulatory Visit: Payer: Self-pay | Admitting: Pulmonary Disease

## 2023-05-20 DIAGNOSIS — R911 Solitary pulmonary nodule: Secondary | ICD-10-CM

## 2023-05-31 DIAGNOSIS — L570 Actinic keratosis: Secondary | ICD-10-CM | POA: Diagnosis not present

## 2023-05-31 DIAGNOSIS — D2262 Melanocytic nevi of left upper limb, including shoulder: Secondary | ICD-10-CM | POA: Diagnosis not present

## 2023-05-31 DIAGNOSIS — Z872 Personal history of diseases of the skin and subcutaneous tissue: Secondary | ICD-10-CM | POA: Diagnosis not present

## 2023-05-31 DIAGNOSIS — Z08 Encounter for follow-up examination after completed treatment for malignant neoplasm: Secondary | ICD-10-CM | POA: Diagnosis not present

## 2023-05-31 DIAGNOSIS — D0462 Carcinoma in situ of skin of left upper limb, including shoulder: Secondary | ICD-10-CM | POA: Diagnosis not present

## 2023-05-31 DIAGNOSIS — D225 Melanocytic nevi of trunk: Secondary | ICD-10-CM | POA: Diagnosis not present

## 2023-05-31 DIAGNOSIS — D2261 Melanocytic nevi of right upper limb, including shoulder: Secondary | ICD-10-CM | POA: Diagnosis not present

## 2023-05-31 DIAGNOSIS — Z85828 Personal history of other malignant neoplasm of skin: Secondary | ICD-10-CM | POA: Diagnosis not present

## 2023-05-31 DIAGNOSIS — D2271 Melanocytic nevi of right lower limb, including hip: Secondary | ICD-10-CM | POA: Diagnosis not present

## 2023-05-31 DIAGNOSIS — D485 Neoplasm of uncertain behavior of skin: Secondary | ICD-10-CM | POA: Diagnosis not present

## 2023-06-08 ENCOUNTER — Ambulatory Visit
Admission: RE | Admit: 2023-06-08 | Discharge: 2023-06-08 | Disposition: A | Discharge status: Home / Self Care | Payer: PPO | Source: Ambulatory Visit | Attending: Pulmonary Disease | Admitting: Pulmonary Disease

## 2023-06-08 DIAGNOSIS — R911 Solitary pulmonary nodule: Secondary | ICD-10-CM | POA: Insufficient documentation

## 2023-06-08 DIAGNOSIS — J439 Emphysema, unspecified: Secondary | ICD-10-CM | POA: Diagnosis not present

## 2023-06-08 DIAGNOSIS — R918 Other nonspecific abnormal finding of lung field: Secondary | ICD-10-CM | POA: Diagnosis not present

## 2023-06-08 MED ORDER — IOHEXOL 300 MG/ML  SOLN
75.0000 mL | Freq: Once | INTRAMUSCULAR | Status: AC | PRN
  Administered 2023-06-08: 75 mL via INTRAVENOUS

## 2023-06-08 MED ORDER — IOHEXOL 350 MG/ML SOLN: 75.0000 mL | Freq: Once | INTRAVENOUS | Status: DC | PRN

## 2023-06-28 DIAGNOSIS — J431 Panlobular emphysema: Secondary | ICD-10-CM | POA: Diagnosis not present

## 2023-07-14 ENCOUNTER — Other Ambulatory Visit: Payer: Self-pay | Admitting: Urology

## 2023-07-20 ENCOUNTER — Encounter: Payer: Self-pay | Admitting: Urology

## 2023-08-02 DIAGNOSIS — D0462 Carcinoma in situ of skin of left upper limb, including shoulder: Secondary | ICD-10-CM | POA: Diagnosis not present

## 2023-09-30 DIAGNOSIS — H401131 Primary open-angle glaucoma, bilateral, mild stage: Secondary | ICD-10-CM | POA: Diagnosis not present

## 2023-10-28 DIAGNOSIS — H524 Presbyopia: Secondary | ICD-10-CM | POA: Diagnosis not present

## 2023-10-28 DIAGNOSIS — H43813 Vitreous degeneration, bilateral: Secondary | ICD-10-CM | POA: Diagnosis not present

## 2023-10-28 DIAGNOSIS — H2513 Age-related nuclear cataract, bilateral: Secondary | ICD-10-CM | POA: Diagnosis not present

## 2023-11-02 DIAGNOSIS — J441 Chronic obstructive pulmonary disease with (acute) exacerbation: Secondary | ICD-10-CM | POA: Diagnosis not present

## 2023-11-02 DIAGNOSIS — J431 Panlobular emphysema: Secondary | ICD-10-CM | POA: Diagnosis not present

## 2023-11-15 DIAGNOSIS — R051 Acute cough: Secondary | ICD-10-CM | POA: Diagnosis not present

## 2023-11-29 DIAGNOSIS — E7800 Pure hypercholesterolemia, unspecified: Secondary | ICD-10-CM | POA: Diagnosis not present

## 2023-11-29 DIAGNOSIS — N1831 Chronic kidney disease, stage 3a: Secondary | ICD-10-CM | POA: Diagnosis not present

## 2023-11-29 DIAGNOSIS — J432 Centrilobular emphysema: Secondary | ICD-10-CM | POA: Diagnosis not present

## 2023-11-29 DIAGNOSIS — J302 Other seasonal allergic rhinitis: Secondary | ICD-10-CM | POA: Diagnosis not present

## 2023-11-29 DIAGNOSIS — Z0000 Encounter for general adult medical examination without abnormal findings: Secondary | ICD-10-CM | POA: Diagnosis not present

## 2023-11-29 DIAGNOSIS — N400 Enlarged prostate without lower urinary tract symptoms: Secondary | ICD-10-CM | POA: Diagnosis not present

## 2023-11-29 DIAGNOSIS — I10 Essential (primary) hypertension: Secondary | ICD-10-CM | POA: Diagnosis not present

## 2023-11-29 DIAGNOSIS — M1A0720 Idiopathic chronic gout, left ankle and foot, without tophus (tophi): Secondary | ICD-10-CM | POA: Diagnosis not present

## 2024-01-10 DIAGNOSIS — D2261 Melanocytic nevi of right upper limb, including shoulder: Secondary | ICD-10-CM | POA: Diagnosis not present

## 2024-01-10 DIAGNOSIS — C44622 Squamous cell carcinoma of skin of right upper limb, including shoulder: Secondary | ICD-10-CM | POA: Diagnosis not present

## 2024-01-10 DIAGNOSIS — D0462 Carcinoma in situ of skin of left upper limb, including shoulder: Secondary | ICD-10-CM | POA: Diagnosis not present

## 2024-01-10 DIAGNOSIS — L570 Actinic keratosis: Secondary | ICD-10-CM | POA: Diagnosis not present

## 2024-01-10 DIAGNOSIS — D2262 Melanocytic nevi of left upper limb, including shoulder: Secondary | ICD-10-CM | POA: Diagnosis not present

## 2024-01-10 DIAGNOSIS — D2272 Melanocytic nevi of left lower limb, including hip: Secondary | ICD-10-CM | POA: Diagnosis not present

## 2024-01-10 DIAGNOSIS — Z85828 Personal history of other malignant neoplasm of skin: Secondary | ICD-10-CM | POA: Diagnosis not present

## 2024-01-10 DIAGNOSIS — D044 Carcinoma in situ of skin of scalp and neck: Secondary | ICD-10-CM | POA: Diagnosis not present

## 2024-01-10 DIAGNOSIS — D2271 Melanocytic nevi of right lower limb, including hip: Secondary | ICD-10-CM | POA: Diagnosis not present

## 2024-01-10 DIAGNOSIS — D485 Neoplasm of uncertain behavior of skin: Secondary | ICD-10-CM | POA: Diagnosis not present

## 2024-01-30 DIAGNOSIS — L578 Other skin changes due to chronic exposure to nonionizing radiation: Secondary | ICD-10-CM | POA: Diagnosis not present

## 2024-01-30 DIAGNOSIS — L570 Actinic keratosis: Secondary | ICD-10-CM | POA: Diagnosis not present

## 2024-01-30 DIAGNOSIS — C44622 Squamous cell carcinoma of skin of right upper limb, including shoulder: Secondary | ICD-10-CM | POA: Diagnosis not present

## 2024-02-14 DIAGNOSIS — D044 Carcinoma in situ of skin of scalp and neck: Secondary | ICD-10-CM | POA: Diagnosis not present

## 2024-02-27 DIAGNOSIS — D0462 Carcinoma in situ of skin of left upper limb, including shoulder: Secondary | ICD-10-CM | POA: Diagnosis not present

## 2024-04-05 DIAGNOSIS — L570 Actinic keratosis: Secondary | ICD-10-CM | POA: Diagnosis not present

## 2024-04-05 DIAGNOSIS — L578 Other skin changes due to chronic exposure to nonionizing radiation: Secondary | ICD-10-CM | POA: Diagnosis not present

## 2024-05-08 ENCOUNTER — Ambulatory Visit: Payer: Self-pay | Admitting: Urology

## 2024-05-08 VITALS — BP 178/84 | HR 64 | Ht 67.0 in | Wt 140.5 lb

## 2024-05-08 DIAGNOSIS — N400 Enlarged prostate without lower urinary tract symptoms: Secondary | ICD-10-CM | POA: Diagnosis not present

## 2024-05-08 MED ORDER — TAMSULOSIN HCL 0.4 MG PO CAPS: 0.4000 mg | ORAL_CAPSULE | Freq: Every day | ORAL | 3 refills | Status: AC

## 2024-05-08 NOTE — Progress Notes (Signed)
 I,Amy L Pierron,acting as a scribe for Darin Gimenez, MD.,have documented all relevant documentation on the behalf of Darin Gimenez, MD,as directed by  Darin Gimenez, MD while in the presence of Darin Gimenez, MD.  05/08/2024 2:20 PM   Denman Fischer 15-Sep-1936 [id number]  Referring provider: Lorrie Rothman, MD [address] [place],  [place] [address]  Chief Complaint  Patient presents with   Benign Prostatic Hypertrophy    HPI: 88 year-old male with a personal history of benign prostatic hyperplasia (BPH) managed on Flomax  presents today for follow-up.   He uses Sildenafil  as needed for ED and has had a work-up in the past for benign cysts.   He has a history of skin cancer, which has been under treatment for 46 years. Recently, he underwent Mohs surgery at [facility] for skin cancer on his arms, performed by Dr. Robert Chimes.   He has had numerous medical appointments this year, totaling thirteen, and reports feeling generally well.   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 He has a family history of longevity, with relatives living into their nineties.  He does not notice a significant difference in his urinary symptoms since starting Flomax  but acknowledges the medication may be helping. He is concerned about the potential for low blood pressure as a side effect of his medication but has not experienced it.    IPSS     Row Name 05/08/24 0900         International Prostate Symptom Score   How often have you had the sensation of not emptying your bladder? Less than half the time     How often have you had to urinate less than every two hours? Less than 1 in 5 times     How often have you found you stopped and started again several times when you urinated? Less than 1 in 5 times     How often have you found it difficult to postpone urination? Less than 1 in 5 times     How often have you had a weak urinary stream? Less  than 1 in 5 times     How often have you had to strain to start urination? Not at All     How many times did you typically get up at night to urinate? 1 Time     Total IPSS Score 7       Quality of Life due to urinary symptoms   If you were to spend the rest of your life with your urinary condition just the way it is now how would you feel about that? Pleased            Score:  1-7 Mild 8-19 Moderate 20-35 Severe   PMH: Past Medical History:  Diagnosis Date   COPD (chronic obstructive pulmonary disease) ([facility])    Gout    Hypertension    Skin cancer     Surgical History: Past Surgical History:  Procedure Laterality Date   EXTERNAL EAR SURGERY     mohl's surgery      Home Medications:  Allergies as of 05/08/2024   No Known Allergies      Medication List        Accurate as of May 08, 2024  2:20 PM. If you have any questions, ask your nurse or doctor.          Afluria Quadrivalent injection Generic drug: influenza vac split quadrivalent  albuterol 108 (90 Base) MCG/ACT inhaler Commonly known as: VENTOLIN HFA Inhale into the lungs.   amLODipine 5 MG tablet Commonly known as: NORVASC Take by mouth.   cetirizine 10 MG tablet Commonly known as: ZYRTEC Take by mouth.   lisinopril 20 MG tablet Commonly known as: ZESTRIL Take by mouth.   sildenafil  20 MG tablet Commonly known as: REVATIO  Take 3-5 tabs 1 hour prior to intercourse   simvastatin 20 MG tablet Commonly known as: ZOCOR Take by mouth.   Spiriva HandiHaler 18 MCG inhalation capsule Generic drug: tiotropium PLACE ONE CAPSULE INTO INHALER AND INHALE ONCE DAILY   tamsulosin  0.4 MG Caps capsule Commonly known as: FLOMAX  Take 1 capsule (0.4 mg total) by mouth daily.        Family History: Family History  Problem Relation Age of Onset   Dementia Mother    Lung cancer Father    Lung cancer Paternal Uncle    Lung cancer Paternal Grandfather     Social History:  reports that he  quit smoking about 20 years ago. His smoking use included cigarettes. He has never used smokeless tobacco. He reports that he does not drink alcohol and does not use drugs.   Physical Exam: BP (!) 178/84   Pulse 64   Ht 5\' 7"  (1.702 m)   Wt 140 lb 8 oz (63.7 kg)   BMI 22.01 kg/m   Constitutional:  Alert and oriented, No acute distress. HEENT: [place] AT, moist mucus membranes.  Trachea midline, no masses. Neurologic: Grossly intact, no focal deficits, moving all 4 extremities. Psychiatric: Normal mood and affect.   Assessment & Plan:    1. Benign Prostatic Hyperplasia (BPH) - Symptoms are well controlled on Flomax . No significant changes in urinary symptoms.  Monitor for any signs of low blood pressure as Flomax  can cause hypotension.  Return in about 1 year (around 05/08/2025).  I have reviewed the above documentation for accuracy and completeness, and I agree with the above.   Darin Gimenez, MD   [facility] [address], [address] [place], [place] [address] [phone]

## 2024-05-30 DIAGNOSIS — M1A0720 Idiopathic chronic gout, left ankle and foot, without tophus (tophi): Secondary | ICD-10-CM | POA: Diagnosis not present

## 2024-05-30 DIAGNOSIS — N400 Enlarged prostate without lower urinary tract symptoms: Secondary | ICD-10-CM | POA: Diagnosis not present

## 2024-05-30 DIAGNOSIS — J302 Other seasonal allergic rhinitis: Secondary | ICD-10-CM | POA: Diagnosis not present

## 2024-05-30 DIAGNOSIS — Z1331 Encounter for screening for depression: Secondary | ICD-10-CM | POA: Diagnosis not present

## 2024-05-30 DIAGNOSIS — N1831 Chronic kidney disease, stage 3a: Secondary | ICD-10-CM | POA: Diagnosis not present

## 2024-05-30 DIAGNOSIS — E7800 Pure hypercholesterolemia, unspecified: Secondary | ICD-10-CM | POA: Diagnosis not present

## 2024-05-30 DIAGNOSIS — J432 Centrilobular emphysema: Secondary | ICD-10-CM | POA: Diagnosis not present

## 2024-05-30 DIAGNOSIS — I10 Essential (primary) hypertension: Secondary | ICD-10-CM | POA: Diagnosis not present

## 2024-06-04 DIAGNOSIS — J431 Panlobular emphysema: Secondary | ICD-10-CM | POA: Diagnosis not present

## 2024-06-20 DIAGNOSIS — R918 Other nonspecific abnormal finding of lung field: Secondary | ICD-10-CM | POA: Diagnosis not present

## 2024-06-20 DIAGNOSIS — J431 Panlobular emphysema: Secondary | ICD-10-CM | POA: Diagnosis not present

## 2024-06-20 DIAGNOSIS — J432 Centrilobular emphysema: Secondary | ICD-10-CM | POA: Diagnosis not present

## 2024-06-20 DIAGNOSIS — R0600 Dyspnea, unspecified: Secondary | ICD-10-CM | POA: Diagnosis not present

## 2024-07-19 DIAGNOSIS — Z872 Personal history of diseases of the skin and subcutaneous tissue: Secondary | ICD-10-CM | POA: Diagnosis not present

## 2024-07-19 DIAGNOSIS — L570 Actinic keratosis: Secondary | ICD-10-CM | POA: Diagnosis not present

## 2024-07-19 DIAGNOSIS — D225 Melanocytic nevi of trunk: Secondary | ICD-10-CM | POA: Diagnosis not present

## 2024-07-19 DIAGNOSIS — D485 Neoplasm of uncertain behavior of skin: Secondary | ICD-10-CM | POA: Diagnosis not present

## 2024-07-19 DIAGNOSIS — L821 Other seborrheic keratosis: Secondary | ICD-10-CM | POA: Diagnosis not present

## 2024-07-19 DIAGNOSIS — Z85828 Personal history of other malignant neoplasm of skin: Secondary | ICD-10-CM | POA: Diagnosis not present

## 2024-07-19 DIAGNOSIS — D2272 Melanocytic nevi of left lower limb, including hip: Secondary | ICD-10-CM | POA: Diagnosis not present

## 2024-07-19 DIAGNOSIS — D2271 Melanocytic nevi of right lower limb, including hip: Secondary | ICD-10-CM | POA: Diagnosis not present

## 2024-07-19 DIAGNOSIS — D0461 Carcinoma in situ of skin of right upper limb, including shoulder: Secondary | ICD-10-CM | POA: Diagnosis not present

## 2024-07-19 DIAGNOSIS — D2261 Melanocytic nevi of right upper limb, including shoulder: Secondary | ICD-10-CM | POA: Diagnosis not present

## 2024-07-19 DIAGNOSIS — Z08 Encounter for follow-up examination after completed treatment for malignant neoplasm: Secondary | ICD-10-CM | POA: Diagnosis not present

## 2024-07-19 DIAGNOSIS — L905 Scar conditions and fibrosis of skin: Secondary | ICD-10-CM | POA: Diagnosis not present

## 2024-07-19 DIAGNOSIS — D2262 Melanocytic nevi of left upper limb, including shoulder: Secondary | ICD-10-CM | POA: Diagnosis not present

## 2024-07-19 DIAGNOSIS — D0462 Carcinoma in situ of skin of left upper limb, including shoulder: Secondary | ICD-10-CM | POA: Diagnosis not present

## 2024-09-18 DIAGNOSIS — N1831 Chronic kidney disease, stage 3a: Secondary | ICD-10-CM | POA: Diagnosis not present

## 2024-09-18 DIAGNOSIS — E7800 Pure hypercholesterolemia, unspecified: Secondary | ICD-10-CM | POA: Diagnosis not present

## 2024-09-18 DIAGNOSIS — I10 Essential (primary) hypertension: Secondary | ICD-10-CM | POA: Diagnosis not present

## 2024-10-23 DIAGNOSIS — L570 Actinic keratosis: Secondary | ICD-10-CM | POA: Diagnosis not present

## 2024-10-23 DIAGNOSIS — Z85828 Personal history of other malignant neoplasm of skin: Secondary | ICD-10-CM | POA: Diagnosis not present

## 2024-10-23 DIAGNOSIS — L578 Other skin changes due to chronic exposure to nonionizing radiation: Secondary | ICD-10-CM | POA: Diagnosis not present

## 2025-05-07 ENCOUNTER — Ambulatory Visit: Admitting: Urology
